# Patient Record
Sex: Male | Born: 1980 | Race: White | Hispanic: No | Marital: Married | Smoking: Current every day smoker
Health system: Southern US, Community
[De-identification: ages and names within clinical notes are randomized; demographics above are authoritative.]

## PROBLEM LIST (undated history)

## (undated) DIAGNOSIS — F1910 Other psychoactive substance abuse, uncomplicated: Secondary | ICD-10-CM

---

## 2003-05-12 ENCOUNTER — Ambulatory Visit (HOSPITAL_COMMUNITY): Admission: RE | Admit: 2003-05-12 | Discharge: 2003-05-12 | Payer: Self-pay | Admitting: *Deleted

## 2005-06-20 ENCOUNTER — Inpatient Hospital Stay (HOSPITAL_COMMUNITY): Admission: EM | Admit: 2005-06-20 | Discharge: 2005-06-23 | Payer: Self-pay | Admitting: Emergency Medicine

## 2009-05-02 ENCOUNTER — Emergency Department (HOSPITAL_COMMUNITY): Admission: EM | Admit: 2009-05-02 | Discharge: 2009-05-02 | Payer: Self-pay | Admitting: Family Medicine

## 2009-08-17 ENCOUNTER — Ambulatory Visit (HOSPITAL_COMMUNITY): Payer: Self-pay | Admitting: Psychiatry

## 2009-08-29 ENCOUNTER — Ambulatory Visit (HOSPITAL_COMMUNITY): Payer: Self-pay | Admitting: Marriage and Family Therapist

## 2009-08-29 ENCOUNTER — Ambulatory Visit (HOSPITAL_COMMUNITY): Payer: Self-pay | Admitting: Psychiatry

## 2009-09-13 ENCOUNTER — Ambulatory Visit (HOSPITAL_COMMUNITY): Payer: Self-pay | Admitting: Marriage and Family Therapist

## 2009-09-14 ENCOUNTER — Ambulatory Visit (HOSPITAL_COMMUNITY): Payer: Self-pay | Admitting: Psychiatry

## 2009-10-16 ENCOUNTER — Ambulatory Visit (HOSPITAL_COMMUNITY): Payer: Self-pay | Admitting: Psychiatry

## 2009-10-22 ENCOUNTER — Emergency Department (HOSPITAL_COMMUNITY): Admission: EM | Admit: 2009-10-22 | Discharge: 2009-10-22 | Payer: Self-pay | Admitting: Emergency Medicine

## 2010-01-02 ENCOUNTER — Ambulatory Visit (HOSPITAL_COMMUNITY): Payer: Self-pay | Admitting: Psychiatry

## 2010-02-20 ENCOUNTER — Ambulatory Visit (HOSPITAL_COMMUNITY)
Admission: RE | Admit: 2010-02-20 | Discharge: 2010-02-20 | Payer: Self-pay | Source: Home / Self Care | Attending: Psychiatry | Admitting: Psychiatry

## 2010-03-22 ENCOUNTER — Encounter (HOSPITAL_COMMUNITY): Payer: Self-pay | Admitting: Physician Assistant

## 2010-04-03 ENCOUNTER — Encounter (HOSPITAL_COMMUNITY): Payer: Self-pay | Admitting: Physician Assistant

## 2010-04-03 DIAGNOSIS — F988 Other specified behavioral and emotional disorders with onset usually occurring in childhood and adolescence: Secondary | ICD-10-CM

## 2010-04-03 DIAGNOSIS — F39 Unspecified mood [affective] disorder: Secondary | ICD-10-CM

## 2010-05-01 ENCOUNTER — Encounter (HOSPITAL_COMMUNITY): Payer: Self-pay | Admitting: Physician Assistant

## 2010-06-05 ENCOUNTER — Encounter (HOSPITAL_COMMUNITY): Payer: Self-pay | Admitting: Physician Assistant
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
## 2015-02-09 ENCOUNTER — Emergency Department
Admission: EM | Admit: 2015-02-09 | Discharge: 2015-02-09 | Payer: Medicaid Other | Attending: Emergency Medicine | Admitting: Emergency Medicine

## 2015-02-09 ENCOUNTER — Emergency Department: Payer: Medicaid Other

## 2015-02-09 ENCOUNTER — Encounter: Payer: Self-pay | Admitting: Emergency Medicine

## 2015-02-09 DIAGNOSIS — F17210 Nicotine dependence, cigarettes, uncomplicated: Secondary | ICD-10-CM | POA: Diagnosis not present

## 2015-02-09 DIAGNOSIS — X58XXXA Exposure to other specified factors, initial encounter: Secondary | ICD-10-CM | POA: Insufficient documentation

## 2015-02-09 DIAGNOSIS — Y9389 Activity, other specified: Secondary | ICD-10-CM | POA: Insufficient documentation

## 2015-02-09 DIAGNOSIS — S60222A Contusion of left hand, initial encounter: Secondary | ICD-10-CM | POA: Diagnosis not present

## 2015-02-09 DIAGNOSIS — Y9289 Other specified places as the place of occurrence of the external cause: Secondary | ICD-10-CM | POA: Diagnosis not present

## 2015-02-09 DIAGNOSIS — S6992XA Unspecified injury of left wrist, hand and finger(s), initial encounter: Secondary | ICD-10-CM | POA: Diagnosis present

## 2015-02-09 DIAGNOSIS — Y998 Other external cause status: Secondary | ICD-10-CM | POA: Insufficient documentation
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
## 2015-02-09 NOTE — ED Notes (Signed)
Ice pack given to pt.

## 2015-02-09 NOTE — Discharge Instructions (Signed)
Cryotherapy °Cryotherapy means treatment with cold. Ice or gel packs can be used to reduce both pain and swelling. Ice is the most helpful within the first 24 to 48 hours after an injury or flare-up from overusing a muscle or joint. Sprains, strains, spasms, burning pain, shooting pain, and aches can all be eased with ice. Ice can also be used when recovering from surgery. Ice is effective, has very few side effects, and is safe for most people to use. °PRECAUTIONS  °Ice is not a safe treatment option for people with: °· Raynaud phenomenon. This is a condition affecting small blood vessels in the extremities. Exposure to cold may cause your problems to return. °· Cold hypersensitivity. There are many forms of cold hypersensitivity, including: °¨ Cold urticaria. Red, itchy hives appear on the skin when the tissues begin to warm after being iced. °¨ Cold erythema. This is a red, itchy rash caused by exposure to cold. °¨ Cold hemoglobinuria. Red blood cells break down when the tissues begin to warm after being iced. The hemoglobin that carry oxygen are passed into the urine because they cannot combine with blood proteins fast enough. °· Numbness or altered sensitivity in the area being iced. °If you have any of the following conditions, do not use ice until you have discussed cryotherapy with your caregiver: °· Heart conditions, such as arrhythmia, angina, or chronic heart disease. °· High blood pressure. °· Healing wounds or open skin in the area being iced. °· Current infections. °· Rheumatoid arthritis. °· Poor circulation. °· Diabetes. °Ice slows the blood flow in the region it is applied. This is beneficial when trying to stop inflamed tissues from spreading irritating chemicals to surrounding tissues. However, if you expose your skin to cold temperatures for too long or without the proper protection, you can damage your skin or nerves. Watch for signs of skin damage due to cold. °HOME CARE INSTRUCTIONS °Follow  these tips to use ice and cold packs safely. °· Place a dry or damp towel between the ice and skin. A damp towel will cool the skin more quickly, so you may need to shorten the time that the ice is used. °· For a more rapid response, add gentle compression to the ice. °· Ice for no more than 10 to 20 minutes at a time. The bonier the area you are icing, the less time it will take to get the benefits of ice. °· Check your skin after 5 minutes to make sure there are no signs of a poor response to cold or skin damage. °· Rest 20 minutes or more between uses. °· Once your skin is numb, you can end your treatment. You can test numbness by very lightly touching your skin. The touch should be so light that you do not see the skin dimple from the pressure of your fingertip. When using ice, most people will feel these normal sensations in this order: cold, burning, aching, and numbness. °· Do not use ice on someone who cannot communicate their responses to pain, such as small children or people with dementia. °HOW TO MAKE AN ICE PACK °Ice packs are the most common way to use ice therapy. Other methods include ice massage, ice baths, and cryosprays. Muscle creams that cause a cold, tingly feeling do not offer the same benefits that ice offers and should not be used as a substitute unless recommended by your caregiver. °To make an ice pack, do one of the following: °· Place crushed ice or a   bag of frozen vegetables in a sealable plastic bag. Squeeze out the excess air. Place this bag inside another plastic bag. Slide the bag into a pillowcase or place a damp towel between your skin and the bag.  Mix 3 parts water with 1 part rubbing alcohol. Freeze the mixture in a sealable plastic bag. When you remove the mixture from the freezer, it will be slushy. Squeeze out the excess air. Place this bag inside another plastic bag. Slide the bag into a pillowcase or place a damp towel between your skin and the bag. SEEK MEDICAL CARE  IF:  You develop white spots on your skin. This may give the skin a blotchy (mottled) appearance.  Your skin turns blue or pale.  Your skin becomes waxy or hard.  Your swelling gets worse. MAKE SURE YOU:   Understand these instructions.  Will watch your condition.  Will get help right away if you are not doing well or get worse.   This information is not intended to replace advice given to you by your health care provider. Make sure you discuss any questions you have with your health care provider.   Document Released: 09/16/2010 Document Revised: 02/10/2014 Document Reviewed: 09/16/2010 Elsevier Interactive Patient Education 2016 Else[employer] Inc[employer]  Hand Contusion  A hand contusion is a deep bruise to the hand. Contusions happen when an injury causes bleeding under the skin. Signs of bruising include pain, puffiness (swelling), and discolored skin. The contusion may turn blue, purple, or yellow. HOME CARE  Put ice on the injured area.  Put ice in a plastic bag.  Place a towel between your skin and the bag.  Leave the ice on for 15-20 minutes, 03-04 times a day.  Only take medicines as told by your doctor.  Use an elastic wrap only as told. You may remove the wrap for sleeping, showering, and bathing. Take the wrap off if you lose feeling (have numbness) in your fingers, or they turn blue or cold. Put the wrap on more loosely.  Keep the hand raised (elevated) with pillows.  Avoid using your hand too much if it is painful. GET HELP RIGHT AWAY IF:   You have more redness, puffiness, or pain in your hand.  Your puffiness or pain does not get better with medicine.  You lose feeling in your hand, or you cannot move your fingers.  Your hand turns cold or blue.  You have pain when you move your fingers.  Your hand feels warm.  Your contusion does not get better in 2 days. MAKE SURE YOU:   Understand these instructions.  Will watch this condition.  Will get help  right away if you are not doing well or you get worse.   This information is not intended to replace advice given to you by your health care provider. Make sure you discuss any questions you have with your health care provider.   Document Released: 07/09/2007 Document Revised: 02/10/2014 Document Reviewed: 07/14/2011 Elsevier Interactive Patient Education [employer]

## 2021-01-04 ENCOUNTER — Encounter (HOSPITAL_BASED_OUTPATIENT_CLINIC_OR_DEPARTMENT_OTHER): Payer: Self-pay | Admitting: *Deleted

## 2021-01-04 ENCOUNTER — Emergency Department (HOSPITAL_BASED_OUTPATIENT_CLINIC_OR_DEPARTMENT_OTHER): Payer: Medicaid Other

## 2021-01-04 ENCOUNTER — Other Ambulatory Visit: Payer: Self-pay

## 2021-01-04 ENCOUNTER — Emergency Department (HOSPITAL_BASED_OUTPATIENT_CLINIC_OR_DEPARTMENT_OTHER)
Admission: EM | Admit: 2021-01-04 | Discharge: 2021-01-04 | Disposition: A | Discharge status: Home / Self Care | Payer: Medicaid Other | Attending: Emergency Medicine | Admitting: Emergency Medicine

## 2021-01-04 DIAGNOSIS — S01111A Laceration without foreign body of right eyelid and periocular area, initial encounter: Secondary | ICD-10-CM | POA: Insufficient documentation

## 2021-01-04 DIAGNOSIS — M546 Pain in thoracic spine: Secondary | ICD-10-CM | POA: Insufficient documentation

## 2021-01-04 DIAGNOSIS — F17210 Nicotine dependence, cigarettes, uncomplicated: Secondary | ICD-10-CM | POA: Diagnosis not present

## 2021-01-04 DIAGNOSIS — M545 Low back pain, unspecified: Secondary | ICD-10-CM | POA: Diagnosis not present

## 2021-01-04 DIAGNOSIS — S161XXA Strain of muscle, fascia and tendon at neck level, initial encounter: Secondary | ICD-10-CM | POA: Insufficient documentation

## 2021-01-04 DIAGNOSIS — Y92410 Unspecified street and highway as the place of occurrence of the external cause: Secondary | ICD-10-CM | POA: Diagnosis not present

## 2021-01-04 DIAGNOSIS — S169XXA Unspecified injury of muscle, fascia and tendon at neck level, initial encounter: Secondary | ICD-10-CM | POA: Diagnosis present

## 2021-01-04 HISTORY — DX: Other psychoactive substance abuse, uncomplicated: F19.10

## 2021-01-04 MED ORDER — KETOROLAC TROMETHAMINE 60 MG/2ML IM SOLN
30.0000 mg | Freq: Once | INTRAMUSCULAR | Status: DC
  Filled 2021-01-04: qty 2

## 2021-01-04 MED ORDER — IOHEXOL 350 MG/ML SOLN
100.0000 mL | Freq: Once | INTRAVENOUS | Status: AC | PRN
  Administered 2021-01-04: 75 mL via INTRAVENOUS

## 2021-01-04 MED ORDER — KETOROLAC TROMETHAMINE 30 MG/ML IJ SOLN
30.0000 mg | Freq: Once | INTRAMUSCULAR | Status: AC
  Administered 2021-01-04: 30 mg via INTRAVENOUS

## 2021-01-04 NOTE — ED Notes (Signed)
Pt transported to CT ?
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
## 2021-01-04 NOTE — ED Notes (Signed)
Per provider, ct negative, ok to remove c-collar
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
## 2021-04-19 ENCOUNTER — Emergency Department (HOSPITAL_COMMUNITY): Payer: Medicaid Other

## 2021-04-19 ENCOUNTER — Other Ambulatory Visit: Payer: Self-pay

## 2021-04-19 ENCOUNTER — Encounter (HOSPITAL_COMMUNITY): Payer: Self-pay | Admitting: *Deleted

## 2021-04-19 ENCOUNTER — Inpatient Hospital Stay (HOSPITAL_COMMUNITY)
Admission: EM | Admit: 2021-04-19 | Discharge: 2021-05-02 | DRG: 082 | Disposition: A | Discharge status: Home / Self Care | Payer: Medicaid Other | Attending: Internal Medicine | Admitting: Internal Medicine

## 2021-04-19 DIAGNOSIS — G9341 Metabolic encephalopathy: Secondary | ICD-10-CM

## 2021-04-19 DIAGNOSIS — M7022 Olecranon bursitis, left elbow: Secondary | ICD-10-CM

## 2021-04-19 DIAGNOSIS — S0101XA Laceration without foreign body of scalp, initial encounter: Secondary | ICD-10-CM | POA: Diagnosis present

## 2021-04-19 DIAGNOSIS — S066XAA Traumatic subarachnoid hemorrhage with loss of consciousness status unknown, initial encounter: Principal | ICD-10-CM | POA: Diagnosis present

## 2021-04-19 DIAGNOSIS — M4802 Spinal stenosis, cervical region: Secondary | ICD-10-CM | POA: Diagnosis present

## 2021-04-19 DIAGNOSIS — F1010 Alcohol abuse, uncomplicated: Secondary | ICD-10-CM | POA: Diagnosis present

## 2021-04-19 DIAGNOSIS — Y92410 Unspecified street and highway as the place of occurrence of the external cause: Secondary | ICD-10-CM

## 2021-04-19 DIAGNOSIS — D72829 Elevated white blood cell count, unspecified: Secondary | ICD-10-CM

## 2021-04-19 DIAGNOSIS — Z885 Allergy status to narcotic agent status: Secondary | ICD-10-CM

## 2021-04-19 DIAGNOSIS — S02119A Unspecified fracture of occiput, initial encounter for closed fracture: Secondary | ICD-10-CM | POA: Diagnosis present

## 2021-04-19 DIAGNOSIS — Z79899 Other long term (current) drug therapy: Secondary | ICD-10-CM

## 2021-04-19 DIAGNOSIS — R402142 Coma scale, eyes open, spontaneous, at arrival to emergency department: Secondary | ICD-10-CM | POA: Diagnosis present

## 2021-04-19 DIAGNOSIS — S52509A Unspecified fracture of the lower end of unspecified radius, initial encounter for closed fracture: Secondary | ICD-10-CM

## 2021-04-19 DIAGNOSIS — L02416 Cutaneous abscess of left lower limb: Secondary | ICD-10-CM | POA: Diagnosis present

## 2021-04-19 DIAGNOSIS — S065XAA Traumatic subdural hemorrhage with loss of consciousness status unknown, initial encounter: Secondary | ICD-10-CM | POA: Diagnosis present

## 2021-04-19 DIAGNOSIS — R402362 Coma scale, best motor response, obeys commands, at arrival to emergency department: Secondary | ICD-10-CM | POA: Diagnosis present

## 2021-04-19 DIAGNOSIS — F1123 Opioid dependence with withdrawal: Secondary | ICD-10-CM | POA: Diagnosis not present

## 2021-04-19 DIAGNOSIS — R509 Fever, unspecified: Secondary | ICD-10-CM

## 2021-04-19 DIAGNOSIS — E739 Lactose intolerance, unspecified: Secondary | ICD-10-CM | POA: Diagnosis present

## 2021-04-19 DIAGNOSIS — E871 Hypo-osmolality and hyponatremia: Secondary | ICD-10-CM

## 2021-04-19 DIAGNOSIS — E785 Hyperlipidemia, unspecified: Secondary | ICD-10-CM | POA: Diagnosis present

## 2021-04-19 DIAGNOSIS — F13239 Sedative, hypnotic or anxiolytic dependence with withdrawal, unspecified: Secondary | ICD-10-CM | POA: Diagnosis not present

## 2021-04-19 DIAGNOSIS — F1991 Other psychoactive substance use, unspecified, in remission: Secondary | ICD-10-CM

## 2021-04-19 DIAGNOSIS — I6330 Cerebral infarction due to thrombosis of unspecified cerebral artery: Secondary | ICD-10-CM | POA: Insufficient documentation

## 2021-04-19 DIAGNOSIS — G928 Other toxic encephalopathy: Secondary | ICD-10-CM | POA: Diagnosis present

## 2021-04-19 DIAGNOSIS — R402242 Coma scale, best verbal response, confused conversation, at arrival to emergency department: Secondary | ICD-10-CM | POA: Diagnosis present

## 2021-04-19 DIAGNOSIS — L0291 Cutaneous abscess, unspecified: Secondary | ICD-10-CM

## 2021-04-19 DIAGNOSIS — S01112A Laceration without foreign body of left eyelid and periocular area, initial encounter: Secondary | ICD-10-CM

## 2021-04-19 DIAGNOSIS — Z20822 Contact with and (suspected) exposure to covid-19: Secondary | ICD-10-CM | POA: Diagnosis present

## 2021-04-19 DIAGNOSIS — Z23 Encounter for immunization: Secondary | ICD-10-CM

## 2021-04-19 DIAGNOSIS — F17210 Nicotine dependence, cigarettes, uncomplicated: Secondary | ICD-10-CM | POA: Diagnosis present

## 2021-04-19 DIAGNOSIS — Z720 Tobacco use: Secondary | ICD-10-CM

## 2021-04-19 DIAGNOSIS — S069XAA Unspecified intracranial injury with loss of consciousness status unknown, initial encounter: Secondary | ICD-10-CM

## 2021-04-19 DIAGNOSIS — F419 Anxiety disorder, unspecified: Secondary | ICD-10-CM | POA: Diagnosis present

## 2021-04-19 DIAGNOSIS — I639 Cerebral infarction, unspecified: Secondary | ICD-10-CM

## 2021-04-19 DIAGNOSIS — G8929 Other chronic pain: Secondary | ICD-10-CM | POA: Diagnosis present

## 2021-04-19 LAB — I-STAT CHEM 8, ED
BUN: 16 mg/dL (ref 6–20)
Calcium, Ion: 1.02 mmol/L — ABNORMAL LOW (ref 1.15–1.40)
Chloride: 100 mmol/L (ref 98–111)
Creatinine, Ser: 0.7 mg/dL (ref 0.61–1.24)
Glucose, Bld: 117 mg/dL — ABNORMAL HIGH (ref 70–99)
HCT: 40 % (ref 39.0–52.0)
Hemoglobin: 13.6 g/dL (ref 13.0–17.0)
Potassium: 3.9 mmol/L (ref 3.5–5.1)
Sodium: 137 mmol/L (ref 135–145)
TCO2: 29 mmol/L (ref 22–32)

## 2021-04-19 LAB — SAMPLE TO BLOOD BANK

## 2021-04-19 MED ORDER — TETANUS-DIPHTH-ACELL PERTUSSIS 5-2.5-18.5 LF-MCG/0.5 IM SUSY
0.5000 mL | PREFILLED_SYRINGE | Freq: Once | INTRAMUSCULAR | Status: AC
  Administered 2021-04-19: 0.5 mL via INTRAMUSCULAR

## 2021-04-19 MED ORDER — SODIUM CHLORIDE 0.9 % IV BOLUS
1000.0000 mL | Freq: Once | INTRAVENOUS | Status: AC
  Administered 2021-04-19: 1000 mL via INTRAVENOUS

## 2021-04-19 MED ORDER — LIDOCAINE HCL (PF) 1 % IJ SOLN
30.0000 mL | Freq: Once | INTRAMUSCULAR | Status: AC
  Administered 2021-04-19: 30 mL
  Filled 2021-04-19: qty 30

## 2021-04-19 MED ORDER — CEFAZOLIN SODIUM-DEXTROSE 2-4 GM/100ML-% IV SOLN
2.0000 g | Freq: Once | INTRAVENOUS | Status: AC
  Administered 2021-04-19: 2 g via INTRAVENOUS

## 2021-04-19 MED ORDER — IOHEXOL 300 MG/ML  SOLN
100.0000 mL | Freq: Once | INTRAMUSCULAR | Status: AC | PRN
  Administered 2021-04-19: 100 mL via INTRAVENOUS

## 2021-04-19 MED ORDER — KETAMINE HCL 50 MG/5ML IJ SOSY
0.3000 mg/kg | PREFILLED_SYRINGE | Freq: Once | INTRAMUSCULAR | Status: AC
  Administered 2021-04-19: 20 mg via INTRAVENOUS
  Filled 2021-04-19: qty 5
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
## 2021-04-19 NOTE — ED Notes (Signed)
Difficult IV stick, MD placing US guided IV  ?

## 2021-04-19 NOTE — ED Notes (Signed)
Pt taken to CT with RN 

## 2021-04-20 ENCOUNTER — Emergency Department (HOSPITAL_COMMUNITY): Payer: Medicaid Other

## 2021-04-20 ENCOUNTER — Observation Stay (HOSPITAL_COMMUNITY): Payer: Medicaid Other

## 2021-04-20 DIAGNOSIS — S065XAA Traumatic subdural hemorrhage with loss of consciousness status unknown, initial encounter: Secondary | ICD-10-CM | POA: Diagnosis present

## 2021-04-20 LAB — CBC
HCT: 38.5 % — ABNORMAL LOW (ref 39.0–52.0)
Hemoglobin: 12.8 g/dL — ABNORMAL LOW (ref 13.0–17.0)
MCH: 31.4 pg (ref 26.0–34.0)
MCHC: 33.2 g/dL (ref 30.0–36.0)
MCV: 94.4 fL (ref 80.0–100.0)
Platelets: 271 10*3/uL (ref 150–400)
RBC: 4.08 MIL/uL — ABNORMAL LOW (ref 4.22–5.81)
RDW: 12.7 % (ref 11.5–15.5)
WBC: 10.7 10*3/uL — ABNORMAL HIGH (ref 4.0–10.5)
nRBC: 0 % (ref 0.0–0.2)

## 2021-04-20 LAB — URINALYSIS, ROUTINE W REFLEX MICROSCOPIC
Bilirubin Urine: NEGATIVE
Glucose, UA: NEGATIVE mg/dL
Hgb urine dipstick: NEGATIVE
Ketones, ur: 5 mg/dL — AB
Leukocytes,Ua: NEGATIVE
Nitrite: NEGATIVE
Protein, ur: NEGATIVE mg/dL
Specific Gravity, Urine: 1.034 — ABNORMAL HIGH (ref 1.005–1.030)
pH: 7 (ref 5.0–8.0)

## 2021-04-20 LAB — COMPREHENSIVE METABOLIC PANEL
ALT: 30 U/L (ref 0–44)
AST: 34 U/L (ref 15–41)
Albumin: 3.6 g/dL (ref 3.5–5.0)
Alkaline Phosphatase: 64 U/L (ref 38–126)
Anion gap: 8 (ref 5–15)
BUN: 14 mg/dL (ref 6–20)
CO2: 28 mmol/L (ref 22–32)
Calcium: 8.5 mg/dL — ABNORMAL LOW (ref 8.9–10.3)
Chloride: 100 mmol/L (ref 98–111)
Creatinine, Ser: 0.79 mg/dL (ref 0.61–1.24)
GFR, Estimated: >60 mL/min (ref >60)
Glucose, Bld: 118 mg/dL — ABNORMAL HIGH (ref 70–99)
Potassium: 4 mmol/L (ref 3.5–5.1)
Sodium: 136 mmol/L (ref 135–145)
Total Bilirubin: 0.2 mg/dL — ABNORMAL LOW (ref 0.3–1.2)
Total Protein: 6.1 g/dL — ABNORMAL LOW (ref 6.5–8.1)

## 2021-04-20 LAB — RESP PANEL BY RT-PCR (FLU A&B, COVID) ARPGX2
Influenza A by PCR: NEGATIVE
Influenza B by PCR: NEGATIVE
SARS Coronavirus 2 by RT PCR: NEGATIVE

## 2021-04-20 LAB — PROTIME-INR
INR: 0.9 (ref 0.8–1.2)
Prothrombin Time: 12.5 seconds (ref 11.4–15.2)

## 2021-04-20 LAB — MRSA NEXT GEN BY PCR, NASAL: MRSA by PCR Next Gen: DETECTED — AB

## 2021-04-20 LAB — LACTIC ACID, PLASMA: Lactic Acid, Venous: 1.2 mmol/L (ref 0.5–1.9)

## 2021-04-20 LAB — ETHANOL: Alcohol, Ethyl (B): <10 mg/dL (ref <10)

## 2021-04-20 LAB — HIV ANTIBODY (ROUTINE TESTING W REFLEX): HIV Screen 4th Generation wRfx: NONREACTIVE

## 2021-04-20 MED ORDER — CHLORHEXIDINE GLUCONATE CLOTH 2 % EX PADS: 6.0000 | MEDICATED_PAD | Freq: Every day | CUTANEOUS | Status: DC

## 2021-04-20 MED ORDER — ACETAMINOPHEN 325 MG PO TABS
650.0000 mg | ORAL_TABLET | Freq: Four times a day (QID) | ORAL | Status: DC | PRN
  Administered 2021-04-20 – 2021-04-25 (×8): 650 mg via ORAL
  Filled 2021-04-20 (×8): qty 2

## 2021-04-20 MED ORDER — MUPIROCIN 2 % EX OINT
1.0000 "application " | TOPICAL_OINTMENT | Freq: Two times a day (BID) | CUTANEOUS | Status: AC
  Administered 2021-04-20 – 2021-04-24 (×5): 1 via NASAL
  Filled 2021-04-20 (×2): qty 22

## 2021-04-20 MED ORDER — CHLORHEXIDINE GLUCONATE CLOTH 2 % EX PADS
6.0000 | MEDICATED_PAD | Freq: Every day | CUTANEOUS | Status: DC
  Administered 2021-04-20 – 2021-04-21 (×2): 6 via TOPICAL

## 2021-04-20 MED ORDER — BUPRENORPHINE HCL-NALOXONE HCL 2-0.5 MG SL SUBL
1.0000 | SUBLINGUAL_TABLET | Freq: Every day | SUBLINGUAL | Status: DC
  Administered 2021-04-20: 1 via SUBLINGUAL
  Filled 2021-04-20: qty 1

## 2021-04-20 MED ORDER — WHITE PETROLATUM EX OINT
TOPICAL_OINTMENT | CUTANEOUS | Status: DC | PRN
  Administered 2021-04-20: 1 via TOPICAL
  Filled 2021-04-20 (×2): qty 28.35
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
## 2021-04-20 NOTE — Plan of Care (Signed)
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
## 2021-04-20 NOTE — Progress Notes (Signed)
OT Cancellation Note ? ?Patient Details ?Name: Ethan Pearson ?MRN: [id number] ?DOB: May 07, 1980 ? ? ?Cancelled Treatment:    Reason Eval/Treat Not Completed: OT spoke with evaluating PT.  Advised limited eval due to agitation, OT to continue efforts as appropriate. ? ?Jaquasia Doscher D Ahmoni Edge ?04/20/2021, 1:42 PM ?
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
## 2021-04-20 NOTE — Progress Notes (Signed)
VO from Dr. Danielle Dess for suboxone. ?

## 2021-04-20 NOTE — Progress Notes (Signed)
?  Transition of Care (TOC) Screening Note ? ? ?Patient Details  ?Name: Ethan Pearson ?Date of Birth: 1980/07/05 ? ? ?Transition of Care (TOC) CM/SW Contact:    ?Alfredia Ferguson, LCSW ?Phone Number: ?04/20/2021, 8:35 AM ? ? ? ?Transition of Care Department [facility]) has reviewed patient and noted no immediate TOC needs pending continued medical work-up. TOC team will continue to monitor patient advancement through interdisciplinary progression rounds to support any identified discharge supports as needed. If new patient transition needs arise, please place a TOC consult or reach out to [facility] team.  ?  ?

## 2021-04-20 NOTE — Progress Notes (Signed)
SLP Cancellation Note ? ?Patient Details ?Name: Ethan Pearson ?MRN: [id number] ?DOB: October 14, 1980 ? ? ?Cancelled treatment:        RN and PT advised that SLP hold evaluation today as patient becoming agitated this am. Appears to be well above a Rancho Level IV based on discussion as patient following commands, attending to task and communicating well per their observation. Will f/u next date.  ? ?Ferdinand Lango MA, CCC-SLP ? ? ? ?Ethan Pearson ?04/20/2021, 11:18 AM ? ? ?

## 2021-04-20 NOTE — Progress Notes (Signed)
Patient got out of bed and to the toilet on his own while this nurse was in another patient room. Upon realizing, I attempted to get a urine sample for UDS but patient had completed voiding. Patient returned to bed safely and placed back on monitor. ?

## 2021-04-21 DIAGNOSIS — R402142 Coma scale, eyes open, spontaneous, at arrival to emergency department: Secondary | ICD-10-CM | POA: Diagnosis present

## 2021-04-21 DIAGNOSIS — R402242 Coma scale, best verbal response, confused conversation, at arrival to emergency department: Secondary | ICD-10-CM | POA: Diagnosis present

## 2021-04-21 DIAGNOSIS — Z20822 Contact with and (suspected) exposure to covid-19: Secondary | ICD-10-CM | POA: Diagnosis present

## 2021-04-21 DIAGNOSIS — Z79899 Other long term (current) drug therapy: Secondary | ICD-10-CM | POA: Diagnosis not present

## 2021-04-21 DIAGNOSIS — S062X0A Diffuse traumatic brain injury without loss of consciousness, initial encounter: Secondary | ICD-10-CM | POA: Diagnosis not present

## 2021-04-21 DIAGNOSIS — R402362 Coma scale, best motor response, obeys commands, at arrival to emergency department: Secondary | ICD-10-CM | POA: Diagnosis present

## 2021-04-21 DIAGNOSIS — S02119A Unspecified fracture of occiput, initial encounter for closed fracture: Secondary | ICD-10-CM | POA: Diagnosis present

## 2021-04-21 DIAGNOSIS — L02416 Cutaneous abscess of left lower limb: Secondary | ICD-10-CM | POA: Diagnosis present

## 2021-04-21 DIAGNOSIS — Y92410 Unspecified street and highway as the place of occurrence of the external cause: Secondary | ICD-10-CM | POA: Diagnosis not present

## 2021-04-21 DIAGNOSIS — F17210 Nicotine dependence, cigarettes, uncomplicated: Secondary | ICD-10-CM | POA: Diagnosis present

## 2021-04-21 DIAGNOSIS — Z23 Encounter for immunization: Secondary | ICD-10-CM | POA: Diagnosis not present

## 2021-04-21 DIAGNOSIS — E739 Lactose intolerance, unspecified: Secondary | ICD-10-CM | POA: Diagnosis present

## 2021-04-21 DIAGNOSIS — M7022 Olecranon bursitis, left elbow: Secondary | ICD-10-CM | POA: Diagnosis present

## 2021-04-21 DIAGNOSIS — S065XAA Traumatic subdural hemorrhage with loss of consciousness status unknown, initial encounter: Secondary | ICD-10-CM | POA: Diagnosis present

## 2021-04-21 DIAGNOSIS — F13239 Sedative, hypnotic or anxiolytic dependence with withdrawal, unspecified: Secondary | ICD-10-CM | POA: Diagnosis not present

## 2021-04-21 DIAGNOSIS — L0291 Cutaneous abscess, unspecified: Secondary | ICD-10-CM | POA: Diagnosis not present

## 2021-04-21 DIAGNOSIS — E785 Hyperlipidemia, unspecified: Secondary | ICD-10-CM | POA: Diagnosis present

## 2021-04-21 DIAGNOSIS — F05 Delirium due to known physiological condition: Secondary | ICD-10-CM | POA: Diagnosis not present

## 2021-04-21 DIAGNOSIS — R410 Disorientation, unspecified: Secondary | ICD-10-CM | POA: Diagnosis not present

## 2021-04-21 DIAGNOSIS — F1010 Alcohol abuse, uncomplicated: Secondary | ICD-10-CM | POA: Diagnosis present

## 2021-04-21 DIAGNOSIS — D72829 Elevated white blood cell count, unspecified: Secondary | ICD-10-CM | POA: Diagnosis not present

## 2021-04-21 DIAGNOSIS — S066XAA Traumatic subarachnoid hemorrhage with loss of consciousness status unknown, initial encounter: Secondary | ICD-10-CM | POA: Diagnosis present

## 2021-04-21 DIAGNOSIS — G8929 Other chronic pain: Secondary | ICD-10-CM | POA: Diagnosis present

## 2021-04-21 DIAGNOSIS — S01112A Laceration without foreign body of left eyelid and periocular area, initial encounter: Secondary | ICD-10-CM | POA: Diagnosis present

## 2021-04-21 DIAGNOSIS — G928 Other toxic encephalopathy: Secondary | ICD-10-CM | POA: Diagnosis present

## 2021-04-21 DIAGNOSIS — I639 Cerebral infarction, unspecified: Secondary | ICD-10-CM | POA: Diagnosis not present

## 2021-04-21 DIAGNOSIS — Z720 Tobacco use: Secondary | ICD-10-CM | POA: Diagnosis not present

## 2021-04-21 DIAGNOSIS — F1123 Opioid dependence with withdrawal: Secondary | ICD-10-CM | POA: Diagnosis not present

## 2021-04-21 DIAGNOSIS — M4802 Spinal stenosis, cervical region: Secondary | ICD-10-CM | POA: Diagnosis present

## 2021-04-21 DIAGNOSIS — S0101XA Laceration without foreign body of scalp, initial encounter: Secondary | ICD-10-CM | POA: Diagnosis present

## 2021-04-21 DIAGNOSIS — Z885 Allergy status to narcotic agent status: Secondary | ICD-10-CM | POA: Diagnosis not present

## 2021-04-21 DIAGNOSIS — F1129 Opioid dependence with unspecified opioid-induced disorder: Secondary | ICD-10-CM | POA: Diagnosis not present

## 2021-04-21 DIAGNOSIS — G9341 Metabolic encephalopathy: Secondary | ICD-10-CM | POA: Diagnosis not present

## 2021-04-21 DIAGNOSIS — I6330 Cerebral infarction due to thrombosis of unspecified cerebral artery: Secondary | ICD-10-CM | POA: Diagnosis not present

## 2021-04-21 DIAGNOSIS — I6389 Other cerebral infarction: Secondary | ICD-10-CM | POA: Diagnosis not present

## 2021-04-21 DIAGNOSIS — E871 Hypo-osmolality and hyponatremia: Secondary | ICD-10-CM | POA: Diagnosis not present

## 2021-04-21 LAB — CBC WITH DIFFERENTIAL/PLATELET
Abs Immature Granulocytes: 0.13 10*3/uL — ABNORMAL HIGH (ref 0.00–0.07)
Basophils Absolute: 0 10*3/uL (ref 0.0–0.1)
Basophils Relative: 0 %
Eosinophils Absolute: 0 10*3/uL (ref 0.0–0.5)
Eosinophils Relative: 0 %
HCT: 36.4 % — ABNORMAL LOW (ref 39.0–52.0)
Hemoglobin: 12.5 g/dL — ABNORMAL LOW (ref 13.0–17.0)
Immature Granulocytes: 1 %
Lymphocytes Relative: 7 %
Lymphs Abs: 1.4 10*3/uL (ref 0.7–4.0)
MCH: 30.2 pg (ref 26.0–34.0)
MCHC: 34.3 g/dL (ref 30.0–36.0)
MCV: 87.9 fL (ref 80.0–100.0)
Monocytes Absolute: 1.5 10*3/uL — ABNORMAL HIGH (ref 0.1–1.0)
Monocytes Relative: 7 %
Neutro Abs: 17.8 10*3/uL — ABNORMAL HIGH (ref 1.7–7.7)
Neutrophils Relative %: 85 %
Platelets: 303 10*3/uL (ref 150–400)
RBC: 4.14 MIL/uL — ABNORMAL LOW (ref 4.22–5.81)
RDW: 12.5 % (ref 11.5–15.5)
WBC: 21 10*3/uL — ABNORMAL HIGH (ref 4.0–10.5)
nRBC: 0 % (ref 0.0–0.2)

## 2021-04-21 LAB — HIV ANTIBODY (ROUTINE TESTING W REFLEX): HIV Screen 4th Generation wRfx: NONREACTIVE

## 2021-04-21 LAB — BASIC METABOLIC PANEL
Anion gap: 12 (ref 5–15)
BUN: 14 mg/dL (ref 6–20)
CO2: 22 mmol/L (ref 22–32)
Calcium: 9.1 mg/dL (ref 8.9–10.3)
Chloride: 98 mmol/L (ref 98–111)
Creatinine, Ser: 0.52 mg/dL — ABNORMAL LOW (ref 0.61–1.24)
GFR, Estimated: >60 mL/min (ref >60)
Glucose, Bld: 144 mg/dL — ABNORMAL HIGH (ref 70–99)
Potassium: 3.5 mmol/L (ref 3.5–5.1)
Sodium: 132 mmol/L — ABNORMAL LOW (ref 135–145)

## 2021-04-21 LAB — AMMONIA: Ammonia: 35 umol/L (ref 9–35)

## 2021-04-21 LAB — MAGNESIUM: Magnesium: 1.6 mg/dL — ABNORMAL LOW (ref 1.7–2.4)

## 2021-04-21 LAB — VITAMIN B12: Vitamin B-12: 357 pg/mL (ref 180–914)

## 2021-04-21 LAB — TSH: TSH: 0.077 u[IU]/mL — ABNORMAL LOW (ref 0.350–4.500)

## 2021-04-21 MED ORDER — ONDANSETRON HCL 4 MG/2ML IJ SOLN
4.0000 mg | Freq: Four times a day (QID) | INTRAMUSCULAR | Status: DC | PRN
  Administered 2021-04-21: 4 mg via INTRAVENOUS
  Filled 2021-04-21: qty 2

## 2021-04-21 MED ORDER — FOLIC ACID 1 MG PO TABS
1.0000 mg | ORAL_TABLET | Freq: Every day | ORAL | Status: DC
  Administered 2021-04-21 – 2021-05-02 (×12): 1 mg via ORAL
  Filled 2021-04-21 (×12): qty 1

## 2021-04-21 MED ORDER — MIDAZOLAM HCL 2 MG/2ML IJ SOLN
1.0000 mg | INTRAMUSCULAR | Status: DC | PRN
  Administered 2021-04-21: 1 mg via INTRAVENOUS
  Filled 2021-04-21 (×2): qty 2

## 2021-04-21 MED ORDER — DEXMEDETOMIDINE HCL IN NACL 400 MCG/100ML IV SOLN
0.2000 ug/kg/h | INTRAVENOUS | Status: DC
  Administered 2021-04-21: 0.7 ug/kg/h via INTRAVENOUS
  Administered 2021-04-21: 0.2 ug/kg/h via INTRAVENOUS
  Administered 2021-04-22: 0.9 ug/kg/h via INTRAVENOUS
  Filled 2021-04-21 (×3): qty 100

## 2021-04-21 MED ORDER — ONDANSETRON HCL 4 MG/2ML IJ SOLN
INTRAMUSCULAR | Status: AC
  Filled 2021-04-21: qty 2

## 2021-04-21 MED ORDER — ALPRAZOLAM 0.5 MG PO TABS
0.5000 mg | ORAL_TABLET | Freq: Three times a day (TID) | ORAL | Status: DC
  Administered 2021-04-21 – 2021-05-02 (×35): 0.5 mg via ORAL
  Filled 2021-04-21 (×35): qty 1

## 2021-04-21 MED ORDER — BUPRENORPHINE HCL-NALOXONE HCL 2-0.5 MG SL SUBL
4.0000 | SUBLINGUAL_TABLET | Freq: Every day | SUBLINGUAL | Status: DC
  Administered 2021-04-21: 4 via SUBLINGUAL
  Filled 2021-04-21: qty 4

## 2021-04-21 MED ORDER — ADULT MULTIVITAMIN W/MINERALS CH
1.0000 | ORAL_TABLET | Freq: Every day | ORAL | Status: DC
  Administered 2021-04-21 – 2021-05-02 (×12): 1 via ORAL
  Filled 2021-04-21 (×12): qty 1

## 2021-04-21 MED ORDER — NICOTINE 21 MG/24HR TD PT24
21.0000 mg | MEDICATED_PATCH | Freq: Every day | TRANSDERMAL | Status: DC
  Administered 2021-04-21 – 2021-05-02 (×13): 21 mg via TRANSDERMAL
  Filled 2021-04-21 (×13): qty 1

## 2021-04-21 MED ORDER — GABAPENTIN 600 MG PO TABS
600.0000 mg | ORAL_TABLET | Freq: Three times a day (TID) | ORAL | Status: DC
  Administered 2021-04-21 – 2021-05-02 (×35): 600 mg via ORAL
  Filled 2021-04-21 (×37): qty 1

## 2021-04-21 MED ORDER — BACITRACIN ZINC 500 UNIT/GM EX OINT
TOPICAL_OINTMENT | Freq: Two times a day (BID) | CUTANEOUS | Status: DC
  Administered 2021-04-21 – 2021-04-24 (×2): 1 via TOPICAL
  Filled 2021-04-21: qty 28.4
  Filled 2021-04-21: qty 0.9

## 2021-04-21 NOTE — Progress Notes (Signed)
eLink Physician-Brief Progress Note ?Patient Name: Ethan Pearson ?DOB: May 13, 1980 ?MRN: [id number] ? ? ?Date of Service ? 04/21/2021  ?HPI/Events of Note ? Notified that pt with increasing agitation. ?Mentatnion improving, but attempts to reorient him have been unsuccessful.   ?eICU Interventions ? Pt currently on precedex infusion. Increased ceiling dose to 1.75mcg/kg/hr. Will monitor response. Watch for development of hypotension/bradycardia.   ? ? ? ?  ? ?Thurnell Garbe CRUZ ?04/21/2021, 10:06 PM ?
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
## 2021-04-21 NOTE — Progress Notes (Signed)
Patient ID: Ethan Pearson, male   DOB: September 12, 1980, 41 y.o.   MRN: [id number] ?Patient has been quite agitated today follows commands occasionally but is developed nausea sweating in addition to agitation.  It appears that he is going through some form of withdrawal. ? ?I have asked critical care to evaluate and intervene as appropriate. ? ?His CT scan yesterday shows that there has been some evolution of the small contusions and small subdural hemorrhage that was noted.  These are noncritical nature.  We will continue to manage him medically.  He should remain in the intensive care unit for now. ?

## 2021-04-21 NOTE — Progress Notes (Signed)
Patient responds at times and at others does not. Will follow commands at times and at others will not. Patient got out of bed without notification, would not sit down when asked to have a seat. Patient ran to bathroom where he had a large BM in the toilet and then persisted to vomit all over the floor not wanting to use bucket provided. Patient also started perspiring. Writer spoke to Dr. Elyn Peers and obtained order for wrist restraints as well as zofran. Primifit placed on patient to prevent voiding in bed and restraints applied.  ?
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
## 2021-04-21 NOTE — Progress Notes (Signed)
Patient found attempting to get out of bed and vomiting. Along with other staff, this RN cleaned patient and returned safely to bed. Requested posey belt in addition to wrist restraints already in place. Also requested medications to help with withdrawal. CCM restarted home meds and added precedex for agitation. Nsx gave VO to remove cervical collar. Patient currently resting calmly in bed. ?

## 2021-04-21 NOTE — Progress Notes (Addendum)
Pt woke up very agitated, c/o pain and discomfort of being "tied down"; pt with bilateral soft wrist restraints and waist belt restraint r/t pt impulsiveness and inability to remain in the bed and follow safety standards. Pt able to answer person, place and situation orientation questions; anything further he gets agitated. Pt had also removed his external urinary catheter, which was explained to him in place to assist with "catching" his urine and keeping his bed dry and clean; pt requested to have the urinal nearby instead. While in the room with the pt, waist belt was loosened and wrist restraints untied so pt could move more freely. Pt also requesting "something for pain", stating his back and head hurt. Pt given his currently prescribed evening medication, then inquired about his suboxone which he takes at home 3 times a day. [facility] called to inquire about increasing pt suboxone dose, based off dayshift note, or current Precedex dose to assist with pt agitation. Pt currently relaxed back in bed after being told of the medication inquiry to the dr. Fredderick Phenix re-secured so pt does not feel constrained but also maintaining pt safety.  ?
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
## 2021-04-21 NOTE — Progress Notes (Signed)
With assistance of OT, this RN dressed patient's head lacerations with saline gauze on front and bacitracin on back. Patient tolerated process well. Upon returning to bed, patient removed all dressings. ?

## 2021-04-22 DIAGNOSIS — E871 Hypo-osmolality and hyponatremia: Secondary | ICD-10-CM | POA: Diagnosis not present

## 2021-04-22 DIAGNOSIS — S065XAA Traumatic subdural hemorrhage with loss of consciousness status unknown, initial encounter: Secondary | ICD-10-CM | POA: Diagnosis not present

## 2021-04-22 DIAGNOSIS — R410 Disorientation, unspecified: Secondary | ICD-10-CM

## 2021-04-22 LAB — CBC
HCT: 37.5 % — ABNORMAL LOW (ref 39.0–52.0)
Hemoglobin: 13.2 g/dL (ref 13.0–17.0)
MCH: 30.6 pg (ref 26.0–34.0)
MCHC: 35.2 g/dL (ref 30.0–36.0)
MCV: 87 fL (ref 80.0–100.0)
Platelets: 266 10*3/uL (ref 150–400)
RBC: 4.31 MIL/uL (ref 4.22–5.81)
RDW: 12.4 % (ref 11.5–15.5)
WBC: 15.4 10*3/uL — ABNORMAL HIGH (ref 4.0–10.5)
nRBC: 0 % (ref 0.0–0.2)

## 2021-04-22 LAB — COMPREHENSIVE METABOLIC PANEL
ALT: 24 U/L (ref 0–44)
AST: 21 U/L (ref 15–41)
Albumin: 3.5 g/dL (ref 3.5–5.0)
Alkaline Phosphatase: 65 U/L (ref 38–126)
Anion gap: 9 (ref 5–15)
BUN: 15 mg/dL (ref 6–20)
CO2: 24 mmol/L (ref 22–32)
Calcium: 9.2 mg/dL (ref 8.9–10.3)
Chloride: 101 mmol/L (ref 98–111)
Creatinine, Ser: 0.67 mg/dL (ref 0.61–1.24)
GFR, Estimated: >60 mL/min (ref >60)
Glucose, Bld: 135 mg/dL — ABNORMAL HIGH (ref 70–99)
Potassium: 3.9 mmol/L (ref 3.5–5.1)
Sodium: 134 mmol/L — ABNORMAL LOW (ref 135–145)
Total Bilirubin: 0.6 mg/dL (ref 0.3–1.2)
Total Protein: 6.9 g/dL (ref 6.5–8.1)

## 2021-04-22 LAB — RPR: RPR Ser Ql: NONREACTIVE

## 2021-04-22 LAB — T4, FREE: Free T4: 1.02 ng/dL (ref 0.61–1.12)

## 2021-04-22 LAB — MAGNESIUM: Magnesium: 2 mg/dL (ref 1.7–2.4)

## 2021-04-22 MED ORDER — BUPRENORPHINE HCL-NALOXONE HCL 8-2 MG SL SUBL
1.0000 | SUBLINGUAL_TABLET | Freq: Two times a day (BID) | SUBLINGUAL | Status: DC
  Administered 2021-04-22: 1 via SUBLINGUAL
  Filled 2021-04-22: qty 1

## 2021-04-22 MED ORDER — THIAMINE HCL 100 MG PO TABS
100.0000 mg | ORAL_TABLET | Freq: Every day | ORAL | Status: DC
  Administered 2021-04-23 – 2021-05-02 (×10): 100 mg via ORAL
  Filled 2021-04-22 (×10): qty 1

## 2021-04-22 MED ORDER — BUPRENORPHINE HCL-NALOXONE HCL 8-2 MG SL SUBL
1.0000 | SUBLINGUAL_TABLET | Freq: Three times a day (TID) | SUBLINGUAL | Status: DC
  Administered 2021-04-22 – 2021-04-25 (×11): 1 via SUBLINGUAL
  Filled 2021-04-22 (×14): qty 1
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
## 2021-04-23 DIAGNOSIS — F05 Delirium due to known physiological condition: Secondary | ICD-10-CM

## 2021-04-23 DIAGNOSIS — S065XAA Traumatic subdural hemorrhage with loss of consciousness status unknown, initial encounter: Secondary | ICD-10-CM | POA: Diagnosis not present

## 2021-04-23 DIAGNOSIS — F1129 Opioid dependence with unspecified opioid-induced disorder: Secondary | ICD-10-CM | POA: Diagnosis not present

## 2021-04-23 LAB — T3, FREE: T3, Free: 2 pg/mL (ref 2.0–4.4)

## 2021-04-23 NOTE — Progress Notes (Signed)
Patient continues to pull off tele monitoring equipment and refuses to leave it on.  Patient has been educated on the importance of tele monitoring and continues to keep it on. Dr. Windell Norfolk has been notified.   ?
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
## 2021-04-23 NOTE — Progress Notes (Signed)
Patient removed tele box from his chest and threw the box and wires against the door.  MD and charge RN notified. ?
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
## 2021-04-24 DIAGNOSIS — D72829 Elevated white blood cell count, unspecified: Secondary | ICD-10-CM

## 2021-04-24 DIAGNOSIS — S52509A Unspecified fracture of the lower end of unspecified radius, initial encounter for closed fracture: Secondary | ICD-10-CM

## 2021-04-24 DIAGNOSIS — G9341 Metabolic encephalopathy: Secondary | ICD-10-CM

## 2021-04-24 DIAGNOSIS — S069XAA Unspecified intracranial injury with loss of consciousness status unknown, initial encounter: Secondary | ICD-10-CM

## 2021-04-24 DIAGNOSIS — Z720 Tobacco use: Secondary | ICD-10-CM

## 2021-04-24 DIAGNOSIS — S065XAA Traumatic subdural hemorrhage with loss of consciousness status unknown, initial encounter: Secondary | ICD-10-CM | POA: Diagnosis not present

## 2021-04-24 DIAGNOSIS — F1991 Other psychoactive substance use, unspecified, in remission: Secondary | ICD-10-CM

## 2021-04-24 LAB — COMPREHENSIVE METABOLIC PANEL
ALT: 20 U/L (ref 0–44)
AST: 17 U/L (ref 15–41)
Albumin: 3.2 g/dL — ABNORMAL LOW (ref 3.5–5.0)
Alkaline Phosphatase: 57 U/L (ref 38–126)
Anion gap: 13 (ref 5–15)
BUN: 10 mg/dL (ref 6–20)
CO2: 23 mmol/L (ref 22–32)
Calcium: 8.9 mg/dL (ref 8.9–10.3)
Chloride: 95 mmol/L — ABNORMAL LOW (ref 98–111)
Creatinine, Ser: 0.69 mg/dL (ref 0.61–1.24)
GFR, Estimated: >60 mL/min (ref >60)
Glucose, Bld: 156 mg/dL — ABNORMAL HIGH (ref 70–99)
Potassium: 3.5 mmol/L (ref 3.5–5.1)
Sodium: 131 mmol/L — ABNORMAL LOW (ref 135–145)
Total Bilirubin: 0.7 mg/dL (ref 0.3–1.2)
Total Protein: 6.1 g/dL — ABNORMAL LOW (ref 6.5–8.1)

## 2021-04-24 LAB — CBC WITH DIFFERENTIAL/PLATELET
Abs Immature Granulocytes: 0.09 10*3/uL — ABNORMAL HIGH (ref 0.00–0.07)
Basophils Absolute: 0 10*3/uL (ref 0.0–0.1)
Basophils Relative: 0 %
Eosinophils Absolute: 0 10*3/uL (ref 0.0–0.5)
Eosinophils Relative: 0 %
HCT: 34.9 % — ABNORMAL LOW (ref 39.0–52.0)
Hemoglobin: 12.6 g/dL — ABNORMAL LOW (ref 13.0–17.0)
Immature Granulocytes: 1 %
Lymphocytes Relative: 11 %
Lymphs Abs: 1.9 10*3/uL (ref 0.7–4.0)
MCH: 31.3 pg (ref 26.0–34.0)
MCHC: 36.1 g/dL — ABNORMAL HIGH (ref 30.0–36.0)
MCV: 86.6 fL (ref 80.0–100.0)
Monocytes Absolute: 1.2 10*3/uL — ABNORMAL HIGH (ref 0.1–1.0)
Monocytes Relative: 7 %
Neutro Abs: 14.6 10*3/uL — ABNORMAL HIGH (ref 1.7–7.7)
Neutrophils Relative %: 81 %
Platelets: 300 10*3/uL (ref 150–400)
RBC: 4.03 MIL/uL — ABNORMAL LOW (ref 4.22–5.81)
RDW: 12.2 % (ref 11.5–15.5)
WBC: 17.9 10*3/uL — ABNORMAL HIGH (ref 4.0–10.5)
nRBC: 0 % (ref 0.0–0.2)

## 2021-04-24 MED ORDER — MELATONIN 5 MG PO TABS
5.0000 mg | ORAL_TABLET | Freq: Every evening | ORAL | Status: DC | PRN
  Administered 2021-04-24 – 2021-04-29 (×6): 5 mg via ORAL
  Filled 2021-04-24 (×6): qty 1
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
## 2021-04-24 NOTE — Assessment & Plan Note (Addendum)
Improved, related to withdrawal ?Patient placed back on home regimen xanax ?He started refusing suboxone when we started oxycodone,  ?Suboxone discontinued to avoid precipitating withdrawal with continued need for pain management with oxycodone -likely resume Suboxone on discharge. ?

## 2021-04-24 NOTE — Progress Notes (Signed)
PT Cancellation Note ? ?Patient Details ?Name: Ethan Pearson ?MRN: [id number] ?DOB: [birth year] ? ? ?Cancelled Treatment:    Reason Eval/Treat Not Completed: Patient declined, no reason specified.  Pt refused after having worked with OT for less than 10 min. ?04/24/2021 ? ?Jacinto Halim., PT ?Acute Rehabilitation Services ?[phone]  (pager) ?[phone]  (office) ? ? ?Eliseo Gum Karlis Cregg ?04/24/2021, 5:33 PM ?
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
## 2021-04-24 NOTE — Progress Notes (Signed)
Patient ID: Ethan Pearson, male   DOB: 15-Nov-1980, 41 y.o.   MRN: [id number] ?Neurologically stable ?Follow-up with neurosurgery can be on a as needed basis. ?

## 2021-04-24 NOTE — Assessment & Plan Note (Addendum)
Hx IV heroin use ?Chronic suboxone and benzo ?We will need to follow back up at his outpatient Suboxone clinic on discharge. ?

## 2021-04-24 NOTE — Assessment & Plan Note (Addendum)
Possibly reactive -  ?Does have bursitis of L elbow and superficial abscess of R knee ?Blood cx from 3/19 no growth, afebrile ?On doxycycline for abscess, culture pending ?-Augmentin added to regimen 04/30/2021 with improving leukocytosis which was subsequently fluctuating. ?-Outpatient follow-up. ?
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
## 2021-04-24 NOTE — Assessment & Plan Note (Addendum)
Cast removed per ortho, he's been placed in velcro splint ?Per ortho, can d/c over next week or so as pain and function allows ?Patient seen by PT and will follow-up with PT in the outpatient setting. ?

## 2021-04-25 ENCOUNTER — Inpatient Hospital Stay (HOSPITAL_COMMUNITY): Payer: Medicaid Other

## 2021-04-25 DIAGNOSIS — I639 Cerebral infarction, unspecified: Secondary | ICD-10-CM

## 2021-04-25 DIAGNOSIS — S01112A Laceration without foreign body of left eyelid and periocular area, initial encounter: Secondary | ICD-10-CM

## 2021-04-25 DIAGNOSIS — S065XAA Traumatic subdural hemorrhage with loss of consciousness status unknown, initial encounter: Secondary | ICD-10-CM | POA: Diagnosis not present

## 2021-04-25 DIAGNOSIS — E871 Hypo-osmolality and hyponatremia: Secondary | ICD-10-CM

## 2021-04-25 LAB — COMPREHENSIVE METABOLIC PANEL
ALT: 17 U/L (ref 0–44)
AST: 13 U/L — ABNORMAL LOW (ref 15–41)
Albumin: 3.2 g/dL — ABNORMAL LOW (ref 3.5–5.0)
Alkaline Phosphatase: 53 U/L (ref 38–126)
Anion gap: 9 (ref 5–15)
BUN: 11 mg/dL (ref 6–20)
CO2: 22 mmol/L (ref 22–32)
Calcium: 8.8 mg/dL — ABNORMAL LOW (ref 8.9–10.3)
Chloride: 98 mmol/L (ref 98–111)
Creatinine, Ser: 0.54 mg/dL — ABNORMAL LOW (ref 0.61–1.24)
GFR, Estimated: >60 mL/min (ref >60)
Glucose, Bld: 123 mg/dL — ABNORMAL HIGH (ref 70–99)
Potassium: 4.6 mmol/L (ref 3.5–5.1)
Sodium: 129 mmol/L — ABNORMAL LOW (ref 135–145)
Total Bilirubin: 1.1 mg/dL (ref 0.3–1.2)
Total Protein: 6.4 g/dL — ABNORMAL LOW (ref 6.5–8.1)

## 2021-04-25 LAB — CBC WITH DIFFERENTIAL/PLATELET
Abs Immature Granulocytes: 0.09 10*3/uL — ABNORMAL HIGH (ref 0.00–0.07)
Basophils Absolute: 0 10*3/uL (ref 0.0–0.1)
Basophils Relative: 0 %
Eosinophils Absolute: 0 10*3/uL (ref 0.0–0.5)
Eosinophils Relative: 0 %
HCT: 36 % — ABNORMAL LOW (ref 39.0–52.0)
Hemoglobin: 12.3 g/dL — ABNORMAL LOW (ref 13.0–17.0)
Immature Granulocytes: 1 %
Lymphocytes Relative: 14 %
Lymphs Abs: 2.6 10*3/uL (ref 0.7–4.0)
MCH: 30.1 pg (ref 26.0–34.0)
MCHC: 34.2 g/dL (ref 30.0–36.0)
MCV: 88 fL (ref 80.0–100.0)
Monocytes Absolute: 1.6 10*3/uL — ABNORMAL HIGH (ref 0.1–1.0)
Monocytes Relative: 9 %
Neutro Abs: 14.5 10*3/uL — ABNORMAL HIGH (ref 1.7–7.7)
Neutrophils Relative %: 76 %
Platelets: 313 10*3/uL (ref 150–400)
RBC: 4.09 MIL/uL — ABNORMAL LOW (ref 4.22–5.81)
RDW: 12.2 % (ref 11.5–15.5)
WBC: 18.8 10*3/uL — ABNORMAL HIGH (ref 4.0–10.5)
nRBC: 0 % (ref 0.0–0.2)

## 2021-04-25 LAB — OSMOLALITY, URINE: Osmolality, Ur: 457 mOsm/kg (ref 300–900)

## 2021-04-25 LAB — MAGNESIUM: Magnesium: 1.9 mg/dL (ref 1.7–2.4)

## 2021-04-25 LAB — PHOSPHORUS: Phosphorus: 4.1 mg/dL (ref 2.5–4.6)

## 2021-04-25 LAB — SODIUM, URINE, RANDOM: Sodium, Ur: 49 mmol/L

## 2021-04-25 LAB — OSMOLALITY: Osmolality: 271 mOsm/kg — ABNORMAL LOW (ref 275–295)

## 2021-04-25 MED ORDER — ACETAMINOPHEN 325 MG PO TABS
650.0000 mg | ORAL_TABLET | Freq: Four times a day (QID) | ORAL | Status: DC | PRN
  Administered 2021-04-25 – 2021-04-30 (×6): 650 mg via ORAL
  Filled 2021-04-25 (×6): qty 2

## 2021-04-25 MED ORDER — ACETAMINOPHEN 325 MG PO TABS: 650.0000 mg | ORAL_TABLET | ORAL | Status: DC | PRN

## 2021-04-25 MED ORDER — ACETAMINOPHEN 325 MG PO TABS: 650.0000 mg | ORAL_TABLET | Freq: Four times a day (QID) | ORAL | Status: DC | PRN

## 2021-04-25 MED ORDER — ACETAMINOPHEN 325 MG PO TABS
650.0000 mg | ORAL_TABLET | Freq: Once | ORAL | Status: AC
  Administered 2021-04-25: 650 mg via ORAL
  Filled 2021-04-25: qty 2
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
## 2021-04-25 NOTE — Assessment & Plan Note (Addendum)
Negative plain films of bilateral le's ?Was placed on oxycodone every 6 hours as needed during the hospitalization. ?Due to history of being on prior Suboxone patient was discharged on ibuprofen 800 mg 3 times daily x5 days and needs to follow-up in the Suboxone clinic. ?

## 2021-04-25 NOTE — Progress Notes (Signed)
Patient ID: Ethan Pearson, male   DOB: 03/22/1980, 41 y.o.   MRN: [id number] ?Patient seen for further follow-up regarding closed head injury.  He has been complaining of headache and head pain and repeat CT today demonstrates that the patient's contusions in the frontal and temporal lobe on the right side are shown her typical evolution there is no new bleeding however it is noted that there is subtle changes of some cerebellar infarcts these are ischemic processes.  Patient does have a nondisplaced occipital bone fracture and concern is whether he could have some vascular injury.  Discussed with Dr. Lowell Guitar this evening and he will order MRI and MR imaging of the cervical vessels particularly in regards to the vertebral arteries.  Of also suggested a consultation with neurology service regarding stroke.  Continue supportive care for now. ?

## 2021-04-25 NOTE — Progress Notes (Signed)
This nurse arrived to the unit, and unit nurse, Johnney Ou, reported that he no longer needed an IV start. Fran Lowes, RN VAST ?

## 2021-04-25 NOTE — Assessment & Plan Note (Addendum)
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  ?Repeat cortisol level at 23.2.  ?-Sodium levels improved with fluid restriction.   ?-Patient with discharge on 1 L/day fluid restriction. ?

## 2021-04-25 NOTE — Assessment & Plan Note (Addendum)
Repaired in ED with prolene on 3/18 ?Sutures removed ?
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
## 2021-04-26 ENCOUNTER — Inpatient Hospital Stay (HOSPITAL_COMMUNITY): Payer: Medicaid Other

## 2021-04-26 ENCOUNTER — Encounter (HOSPITAL_COMMUNITY): Payer: Self-pay

## 2021-04-26 DIAGNOSIS — S065XAA Traumatic subdural hemorrhage with loss of consciousness status unknown, initial encounter: Secondary | ICD-10-CM | POA: Diagnosis not present

## 2021-04-26 DIAGNOSIS — D72829 Elevated white blood cell count, unspecified: Secondary | ICD-10-CM | POA: Diagnosis not present

## 2021-04-26 DIAGNOSIS — S062X0A Diffuse traumatic brain injury without loss of consciousness, initial encounter: Secondary | ICD-10-CM

## 2021-04-26 DIAGNOSIS — Z720 Tobacco use: Secondary | ICD-10-CM | POA: Diagnosis not present

## 2021-04-26 DIAGNOSIS — I6389 Other cerebral infarction: Secondary | ICD-10-CM

## 2021-04-26 DIAGNOSIS — E871 Hypo-osmolality and hyponatremia: Secondary | ICD-10-CM | POA: Diagnosis not present

## 2021-04-26 DIAGNOSIS — I6330 Cerebral infarction due to thrombosis of unspecified cerebral artery: Secondary | ICD-10-CM | POA: Insufficient documentation

## 2021-04-26 DIAGNOSIS — I639 Cerebral infarction, unspecified: Secondary | ICD-10-CM

## 2021-04-26 LAB — BASIC METABOLIC PANEL
Anion gap: 12 (ref 5–15)
Anion gap: 8 (ref 5–15)
BUN: 12 mg/dL (ref 6–20)
BUN: 13 mg/dL (ref 6–20)
CO2: 22 mmol/L (ref 22–32)
CO2: 27 mmol/L (ref 22–32)
Calcium: 8.8 mg/dL — ABNORMAL LOW (ref 8.9–10.3)
Calcium: 9 mg/dL (ref 8.9–10.3)
Chloride: 94 mmol/L — ABNORMAL LOW (ref 98–111)
Chloride: 95 mmol/L — ABNORMAL LOW (ref 98–111)
Creatinine, Ser: 0.63 mg/dL (ref 0.61–1.24)
Creatinine, Ser: 0.71 mg/dL (ref 0.61–1.24)
GFR, Estimated: >60 mL/min (ref >60)
GFR, Estimated: >60 mL/min (ref >60)
Glucose, Bld: 100 mg/dL — ABNORMAL HIGH (ref 70–99)
Glucose, Bld: 135 mg/dL — ABNORMAL HIGH (ref 70–99)
Potassium: 4.5 mmol/L (ref 3.5–5.1)
Potassium: 4.5 mmol/L (ref 3.5–5.1)
Sodium: 128 mmol/L — ABNORMAL LOW (ref 135–145)
Sodium: 130 mmol/L — ABNORMAL LOW (ref 135–145)

## 2021-04-26 LAB — LIPID PANEL
Cholesterol: 148 mg/dL (ref 0–200)
HDL: 39 mg/dL — ABNORMAL LOW (ref >40)
LDL Cholesterol: 95 mg/dL (ref 0–99)
Total CHOL/HDL Ratio: 3.8 RATIO
Triglycerides: 72 mg/dL (ref <150)
VLDL: 14 mg/dL (ref 0–40)

## 2021-04-26 LAB — RAPID URINE DRUG SCREEN, HOSP PERFORMED
Amphetamines: NOT DETECTED
Barbiturates: NOT DETECTED
Benzodiazepines: POSITIVE — AB
Cocaine: NOT DETECTED
Opiates: NOT DETECTED
Tetrahydrocannabinol: POSITIVE — AB

## 2021-04-26 LAB — ECHOCARDIOGRAM COMPLETE
AR max vel: 2.21 cm2
AV Area VTI: 1.91 cm2
AV Area mean vel: 1.96 cm2
AV Mean grad: 5 mmHg
AV Peak grad: 8.3 mmHg
Ao pk vel: 1.44 m/s
Area-P 1/2: 2.52 cm2
Height: 69 in
S' Lateral: 3 cm
Weight: 2271.62 oz

## 2021-04-26 LAB — HEMOGLOBIN A1C
Hgb A1c MFr Bld: 5.6 % (ref 4.8–5.6)
Mean Plasma Glucose: 114.02 mg/dL

## 2021-04-26 LAB — CULTURE, BLOOD (ROUTINE X 2)
Culture: NO GROWTH
Culture: NO GROWTH

## 2021-04-26 LAB — CBC
HCT: 38 % — ABNORMAL LOW (ref 39.0–52.0)
Hemoglobin: 13 g/dL (ref 13.0–17.0)
MCH: 30.2 pg (ref 26.0–34.0)
MCHC: 34.2 g/dL (ref 30.0–36.0)
MCV: 88.4 fL (ref 80.0–100.0)
Platelets: 339 10*3/uL (ref 150–400)
RBC: 4.3 MIL/uL (ref 4.22–5.81)
RDW: 12.5 % (ref 11.5–15.5)
WBC: 15.1 10*3/uL — ABNORMAL HIGH (ref 4.0–10.5)
nRBC: 0 % (ref 0.0–0.2)

## 2021-04-26 MED ORDER — OXYCODONE HCL 5 MG PO TABS
5.0000 mg | ORAL_TABLET | Freq: Four times a day (QID) | ORAL | Status: DC | PRN
  Administered 2021-04-26 – 2021-05-02 (×17): 5 mg via ORAL
  Filled 2021-04-26 (×18): qty 1

## 2021-04-26 MED ORDER — SODIUM CHLORIDE 0.9 % IV SOLN: INTRAVENOUS | Status: DC

## 2021-04-26 MED ORDER — ROSUVASTATIN CALCIUM 5 MG PO TABS
10.0000 mg | ORAL_TABLET | Freq: Every day | ORAL | Status: DC
  Administered 2021-04-26 – 2021-05-02 (×7): 10 mg via ORAL
  Filled 2021-04-26 (×7): qty 2

## 2021-04-26 MED ORDER — CHLORHEXIDINE GLUCONATE CLOTH 2 % EX PADS: 6.0000 | MEDICATED_PAD | Freq: Every day | CUTANEOUS | Status: DC

## 2021-04-26 MED ORDER — SODIUM CHLORIDE 0.9 % IV SOLN: INTRAVENOUS | Status: AC
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
## 2021-04-26 NOTE — Progress Notes (Signed)
Pt and mother educated on importance of strict intake and output measurements.  Pt and mother verbalized understanding.  Will continue to monitor and provide education as needed.  ?
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
## 2021-04-26 NOTE — Progress Notes (Signed)
Patient transported down to radiology and MRI via bed and transporter. No s/s of distress noted. Alert and Oriented x4.  ?
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
## 2021-04-26 NOTE — Progress Notes (Signed)
Pt and mother at bedside concerned about pts wounds on elbow and posterior hear.  Wounds oozing and left elbow is swollen and sore to touch.  Pt has been refusing ointment on abrasions/wounds.  Education provided to pt on importance of wound care.  Pt and mother verbalized understanding.  MD notified of wounds.  New orders received.  Will continue to monitor.   ?

## 2021-04-26 NOTE — Progress Notes (Signed)
Patient returned back from MRI via bed and transporter. No s/s of distress. Patient alert and oriented. Complains of headache.  ?

## 2021-04-26 NOTE — Progress Notes (Signed)
Echocardiogram ?2D Echocardiogram has been performed. ? ?Devonne Doughty ?04/26/2021, 10:34 AM ?
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
## 2021-04-26 NOTE — Progress Notes (Signed)
OT Cancellation Note ? ?Patient Details ?Name: Ethan Pearson ?MRN: [id number] ?DOB: July 21, 1980 ? ? ?Cancelled Treatment:    Reason Eval/Treat Not Completed: Patient declined, no reason specified (Patient asked therapist to come back later today when he is less sleepy.  OT to attempt later today.) ?Alfonse Flavors, OTA ?Acute Rehabilitation Services  ?Pager [phone] ?Office [phone] ? ?Shakiya Mcneary Jeannett Senior ?04/26/2021, 12:33 PM ?

## 2021-04-26 NOTE — Progress Notes (Signed)
PT Cancellation Note ? ?Patient Details ?Name: Ethan Pearson ?MRN: [id number] ?DOB: 27-Feb-1980 ? ? ?Cancelled Treatment:    Reason Eval/Treat Not Completed: Patient declined, no reason specified.   Pt refused x1 will return around 2 pm, pt agrees. ?04/26/2021 ? ?Ginger Carne., PT ?Acute Rehabilitation Services ?[phone]  (pager) ?[phone]  (office) ? ? ?Ethan Pearson ?04/26/2021, 12:18 PM ?

## 2021-04-26 NOTE — Progress Notes (Signed)
SLP Cancellation Note ? ?Patient Details ?Name: Ethan Pearson ?MRN: [id number] ?DOB: 25-Feb-1980 ? ? ?Cancelled treatment:        Attempted cognitive therapy. Pt reported a "bad headache" and asked if we could "do it later". SLP will attempt to return as schedule allows.  ? ? ?Royce Macadamia ?04/26/2021, 10:32 AM ?

## 2021-04-27 ENCOUNTER — Inpatient Hospital Stay (HOSPITAL_COMMUNITY): Payer: Medicaid Other

## 2021-04-27 DIAGNOSIS — Z720 Tobacco use: Secondary | ICD-10-CM | POA: Diagnosis not present

## 2021-04-27 DIAGNOSIS — L0291 Cutaneous abscess, unspecified: Secondary | ICD-10-CM

## 2021-04-27 DIAGNOSIS — D72829 Elevated white blood cell count, unspecified: Secondary | ICD-10-CM | POA: Diagnosis not present

## 2021-04-27 DIAGNOSIS — M7022 Olecranon bursitis, left elbow: Secondary | ICD-10-CM

## 2021-04-27 DIAGNOSIS — E871 Hypo-osmolality and hyponatremia: Secondary | ICD-10-CM | POA: Diagnosis not present

## 2021-04-27 LAB — CBC WITH DIFFERENTIAL/PLATELET
Abs Immature Granulocytes: 0.07 10*3/uL (ref 0.00–0.07)
Basophils Absolute: 0 10*3/uL (ref 0.0–0.1)
Basophils Relative: 0 %
Eosinophils Absolute: 0.1 10*3/uL (ref 0.0–0.5)
Eosinophils Relative: 1 %
HCT: 36 % — ABNORMAL LOW (ref 39.0–52.0)
Hemoglobin: 12.7 g/dL — ABNORMAL LOW (ref 13.0–17.0)
Immature Granulocytes: 0 %
Lymphocytes Relative: 22 %
Lymphs Abs: 3.5 10*3/uL (ref 0.7–4.0)
MCH: 31.2 pg (ref 26.0–34.0)
MCHC: 35.3 g/dL (ref 30.0–36.0)
MCV: 88.5 fL (ref 80.0–100.0)
Monocytes Absolute: 1.4 10*3/uL — ABNORMAL HIGH (ref 0.1–1.0)
Monocytes Relative: 9 %
Neutro Abs: 10.6 10*3/uL — ABNORMAL HIGH (ref 1.7–7.7)
Neutrophils Relative %: 68 %
Platelets: 354 10*3/uL (ref 150–400)
RBC: 4.07 MIL/uL — ABNORMAL LOW (ref 4.22–5.81)
RDW: 12.4 % (ref 11.5–15.5)
WBC: 15.8 10*3/uL — ABNORMAL HIGH (ref 4.0–10.5)
nRBC: 0 % (ref 0.0–0.2)

## 2021-04-27 LAB — BASIC METABOLIC PANEL
Anion gap: 9 (ref 5–15)
BUN: 13 mg/dL (ref 6–20)
CO2: 25 mmol/L (ref 22–32)
Calcium: 8.7 mg/dL — ABNORMAL LOW (ref 8.9–10.3)
Chloride: 95 mmol/L — ABNORMAL LOW (ref 98–111)
Creatinine, Ser: 0.61 mg/dL (ref 0.61–1.24)
GFR, Estimated: >60 mL/min (ref >60)
Glucose, Bld: 100 mg/dL — ABNORMAL HIGH (ref 70–99)
Potassium: 4.6 mmol/L (ref 3.5–5.1)
Sodium: 129 mmol/L — ABNORMAL LOW (ref 135–145)

## 2021-04-27 LAB — DRUG PROFILE, UR, 9 DRUGS (LABCORP)
Amphetamines, Urine: NEGATIVE ng/mL
Barbiturate, Ur: NEGATIVE ng/mL
Benzodiazepine Quant, Ur: NEGATIVE ng/mL
Cannabinoid Quant, Ur: POSITIVE ng/mL — AB
Cocaine (Metab.): NEGATIVE ng/mL
Methadone Screen, Urine: NEGATIVE ng/mL
Opiate Quant, Ur: POSITIVE ng/mL — AB
Phencyclidine, Ur: NEGATIVE ng/mL
Propoxyphene, Urine: NEGATIVE ng/mL

## 2021-04-27 MED ORDER — SODIUM CHLORIDE 0.9 % IV SOLN
INTRAVENOUS | Status: AC
Start: 2021-04-27 — End: 2021-04-28

## 2021-04-27 MED ORDER — LIDOCAINE HCL (PF) 1 % IJ SOLN
5.0000 mL | Freq: Once | INTRAMUSCULAR | Status: AC
  Administered 2021-04-27: 5 mL via SUBCUTANEOUS
  Filled 2021-04-27: qty 5

## 2021-04-27 MED ORDER — DOXYCYCLINE HYCLATE 100 MG PO TABS
100.0000 mg | ORAL_TABLET | Freq: Two times a day (BID) | ORAL | Status: DC
  Administered 2021-04-27 – 2021-05-01 (×8): 100 mg via ORAL
  Filled 2021-04-27 (×8): qty 1
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
## 2021-04-27 NOTE — Assessment & Plan Note (Addendum)
L elbow olecranon bursitis with draining wound, compressive ace wrap per ortho ?Outpatient follow-up with orthopedics. ?

## 2021-04-27 NOTE — Assessment & Plan Note (Addendum)
L knee, s/p drainage by ortho  ?MRSA sensitive to tetracyclines ?Patient placed on doxycycline and will be discharged home on 6 more days of doxycycline to complete a 10-day course of treatment.   ?-Outpatient follow-up with orthopedics and PCP. ?

## 2021-04-28 ENCOUNTER — Inpatient Hospital Stay (HOSPITAL_COMMUNITY): Payer: Medicaid Other

## 2021-04-28 DIAGNOSIS — D72829 Elevated white blood cell count, unspecified: Secondary | ICD-10-CM | POA: Diagnosis not present

## 2021-04-28 DIAGNOSIS — E871 Hypo-osmolality and hyponatremia: Secondary | ICD-10-CM | POA: Diagnosis not present

## 2021-04-28 DIAGNOSIS — Z720 Tobacco use: Secondary | ICD-10-CM | POA: Diagnosis not present

## 2021-04-28 LAB — CBC WITH DIFFERENTIAL/PLATELET
Abs Immature Granulocytes: 0.08 10*3/uL — ABNORMAL HIGH (ref 0.00–0.07)
Basophils Absolute: 0 10*3/uL (ref 0.0–0.1)
Basophils Relative: 0 %
Eosinophils Absolute: 0.1 10*3/uL (ref 0.0–0.5)
Eosinophils Relative: 1 %
HCT: 35.1 % — ABNORMAL LOW (ref 39.0–52.0)
Hemoglobin: 12.2 g/dL — ABNORMAL LOW (ref 13.0–17.0)
Immature Granulocytes: 1 %
Lymphocytes Relative: 20 %
Lymphs Abs: 2.9 10*3/uL (ref 0.7–4.0)
MCH: 30.3 pg (ref 26.0–34.0)
MCHC: 34.8 g/dL (ref 30.0–36.0)
MCV: 87.1 fL (ref 80.0–100.0)
Monocytes Absolute: 1.5 10*3/uL — ABNORMAL HIGH (ref 0.1–1.0)
Monocytes Relative: 10 %
Neutro Abs: 9.5 10*3/uL — ABNORMAL HIGH (ref 1.7–7.7)
Neutrophils Relative %: 68 %
Platelets: 422 10*3/uL — ABNORMAL HIGH (ref 150–400)
RBC: 4.03 MIL/uL — ABNORMAL LOW (ref 4.22–5.81)
RDW: 12.1 % (ref 11.5–15.5)
WBC: 14 10*3/uL — ABNORMAL HIGH (ref 4.0–10.5)
nRBC: 0 % (ref 0.0–0.2)

## 2021-04-28 LAB — MAGNESIUM: Magnesium: 2 mg/dL (ref 1.7–2.4)

## 2021-04-28 LAB — PHOSPHORUS: Phosphorus: 4.1 mg/dL (ref 2.5–4.6)

## 2021-04-28 LAB — COMPREHENSIVE METABOLIC PANEL
ALT: 38 U/L (ref 0–44)
AST: 24 U/L (ref 15–41)
Albumin: 2.9 g/dL — ABNORMAL LOW (ref 3.5–5.0)
Alkaline Phosphatase: 58 U/L (ref 38–126)
Anion gap: 8 (ref 5–15)
BUN: 11 mg/dL (ref 6–20)
CO2: 25 mmol/L (ref 22–32)
Calcium: 8.5 mg/dL — ABNORMAL LOW (ref 8.9–10.3)
Chloride: 94 mmol/L — ABNORMAL LOW (ref 98–111)
Creatinine, Ser: 0.71 mg/dL (ref 0.61–1.24)
GFR, Estimated: >60 mL/min (ref >60)
Glucose, Bld: 129 mg/dL — ABNORMAL HIGH (ref 70–99)
Potassium: 4 mmol/L (ref 3.5–5.1)
Sodium: 127 mmol/L — ABNORMAL LOW (ref 135–145)
Total Bilirubin: 0.2 mg/dL — ABNORMAL LOW (ref 0.3–1.2)
Total Protein: 6.4 g/dL — ABNORMAL LOW (ref 6.5–8.1)

## 2021-04-28 MED ORDER — OXYCODONE HCL 5 MG PO TABS: 5.0000 mg | ORAL_TABLET | Freq: Once | ORAL | Status: DC | PRN

## 2021-04-28 NOTE — Progress Notes (Signed)
Orthopedic Tech Progress Note ?Patient Details:  ?Ethan Pearson ?[birth year] ?[id number] ? ?Ortho Devices ?Type of Ortho Device: Velcro wrist splint ?Ortho Device/Splint Location: rue ?Ortho Device/Splint Interventions: Ordered, Application, Adjustment ?  ?Post Interventions ?Patient Tolerated: Well ? ?[name] ?04/28/2021, 2:36 PM ? ?
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
## 2021-04-29 ENCOUNTER — Inpatient Hospital Stay (HOSPITAL_COMMUNITY): Payer: Medicaid Other

## 2021-04-29 DIAGNOSIS — E871 Hypo-osmolality and hyponatremia: Secondary | ICD-10-CM | POA: Diagnosis not present

## 2021-04-29 DIAGNOSIS — D72829 Elevated white blood cell count, unspecified: Secondary | ICD-10-CM | POA: Diagnosis not present

## 2021-04-29 DIAGNOSIS — R509 Fever, unspecified: Secondary | ICD-10-CM

## 2021-04-29 DIAGNOSIS — Z720 Tobacco use: Secondary | ICD-10-CM | POA: Diagnosis not present

## 2021-04-29 LAB — URINALYSIS, COMPLETE (UACMP) WITH MICROSCOPIC
Bilirubin Urine: NEGATIVE
Glucose, UA: NEGATIVE mg/dL
Hgb urine dipstick: NEGATIVE
Ketones, ur: NEGATIVE mg/dL
Leukocytes,Ua: NEGATIVE
Nitrite: NEGATIVE
Protein, ur: NEGATIVE mg/dL
Specific Gravity, Urine: 1.012 (ref 1.005–1.030)
pH: 7 (ref 5.0–8.0)

## 2021-04-29 LAB — CBC WITH DIFFERENTIAL/PLATELET
Abs Immature Granulocytes: 0.07 10*3/uL (ref 0.00–0.07)
Basophils Absolute: 0 10*3/uL (ref 0.0–0.1)
Basophils Relative: 0 %
Eosinophils Absolute: 0.1 10*3/uL (ref 0.0–0.5)
Eosinophils Relative: 1 %
HCT: 40.7 % (ref 39.0–52.0)
Hemoglobin: 13.6 g/dL (ref 13.0–17.0)
Immature Granulocytes: 1 %
Lymphocytes Relative: 24 %
Lymphs Abs: 2.9 10*3/uL (ref 0.7–4.0)
MCH: 30 pg (ref 26.0–34.0)
MCHC: 33.4 g/dL (ref 30.0–36.0)
MCV: 89.6 fL (ref 80.0–100.0)
Monocytes Absolute: 0.9 10*3/uL (ref 0.1–1.0)
Monocytes Relative: 8 %
Neutro Abs: 8.2 10*3/uL — ABNORMAL HIGH (ref 1.7–7.7)
Neutrophils Relative %: 66 %
Platelets: 458 10*3/uL — ABNORMAL HIGH (ref 150–400)
RBC: 4.54 MIL/uL (ref 4.22–5.81)
RDW: 12.3 % (ref 11.5–15.5)
WBC: 12.2 10*3/uL — ABNORMAL HIGH (ref 4.0–10.5)
nRBC: 0 % (ref 0.0–0.2)

## 2021-04-29 LAB — RAPID URINE DRUG SCREEN, HOSP PERFORMED
Amphetamines: NOT DETECTED
Barbiturates: NOT DETECTED
Benzodiazepines: POSITIVE — AB
Cocaine: NOT DETECTED
Opiates: NOT DETECTED
Tetrahydrocannabinol: POSITIVE — AB

## 2021-04-29 LAB — BASIC METABOLIC PANEL
Anion gap: 8 (ref 5–15)
BUN: 12 mg/dL (ref 6–20)
CO2: 27 mmol/L (ref 22–32)
Calcium: 9 mg/dL (ref 8.9–10.3)
Chloride: 92 mmol/L — ABNORMAL LOW (ref 98–111)
Creatinine, Ser: 0.69 mg/dL (ref 0.61–1.24)
GFR, Estimated: >60 mL/min (ref >60)
Glucose, Bld: 136 mg/dL — ABNORMAL HIGH (ref 70–99)
Potassium: 4.3 mmol/L (ref 3.5–5.1)
Sodium: 127 mmol/L — ABNORMAL LOW (ref 135–145)

## 2021-04-29 LAB — SODIUM, URINE, RANDOM: Sodium, Ur: 67 mmol/L

## 2021-04-29 LAB — OSMOLALITY, URINE: Osmolality, Ur: 433 mOsm/kg (ref 300–900)

## 2021-04-29 MED ORDER — MUPIROCIN 2 % EX OINT
1.0000 "application " | TOPICAL_OINTMENT | Freq: Two times a day (BID) | CUTANEOUS | Status: DC
  Administered 2021-05-01 – 2021-05-02 (×3): 1 via NASAL
  Filled 2021-04-29: qty 22

## 2021-04-29 NOTE — Progress Notes (Signed)
Patient showered, thoroughly cleansing the laceration to his anterior forehead, posterior scalp, left elbow, and left knee. Shower allowed softening and cleansing of the laceration on forehead. Nurse was able to carefully remove the 7 sutures remaining. Slight bleeding noted during removal due to scabbing over the suture sites. Area cleansed and bacitracin applied. Patient tolerated procedure very well.  ? ?A glass shard was noted to the anterior forehead just below patient's hairline. Nurse removed 75mm glass shard carefully. No bleeding noted following removal. Bacitracin applied. Patient tolerated procedure very well. ? ?Left elbow and left knee wounds cleansed thoroughly, bacitracin applied, and wounds wrapped accordingly. Patient tolerate dressing changes well.  ? ?ALL 12 SUTURES HAVE BEEN REMOVED FROM LACERATION TO PATIENT'S FOREHEAD!!!  ? ?
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
## 2021-04-29 NOTE — Progress Notes (Signed)
PT Cancellation Note ? ?Patient Details ?Name: Ethan Pearson ?MRN: [id number] ?DOB: 07-08-1980 ? ? ?Cancelled Treatment:    Reason Eval/Treat Not Completed: Patient declined, no reason specified;Other (comment) (x3) opportunities. ?04/29/2021 ? ?Jacinto Halim., PT ?Acute Rehabilitation Services ?[phone]  (pager) ?[phone]  (office) ? ? ?Eliseo Gum Kham Zuckerman ?04/29/2021, 6:42 PM ?
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
## 2021-04-29 NOTE — Assessment & Plan Note (Addendum)
Had a L elbow bursitis which spontaneously dranied and knee superficial abscess which was drained by orthopedics  ?Culture from abscess with MRSA sensitive to tetracycliens ?Wound to back of head looks more erythematous/swollen and does have some yellowish discharge, but doesn't seem fluctuant or look like it needs drainage at this time. ?-Augmentin added to regimen of doxycycline. ?-Fever curve trended down. ?-Leukocytosis fluctuating however patient continued to remain afebrile after addition of Augmentin. ?-Blood cultures with no growth to date x3 days. ?-Knee abscess with MRSA. ?-Patient continue on doxycycline and Augmentin and be discharged home on 6 more days of antibiotics to complete a 10-day course of treatment. ?-Outpatient follow-up with PCP and orthopedics. ?

## 2021-04-29 NOTE — Assessment & Plan Note (Addendum)
MRI brain with small L cerebella acute infarct ?MRA head/neck normal ?Echo with EF 55-60%, no RWMA ?A1c 5.6 ?LDL 95 ?Patient was seen in consultation by neurology, who suspects acute CVA was due to brain trauma, no evidence of vertebral dissection ?No antiplatelet given hemorrhages and stroke thought related to trauma.  ?Patient maintained on statin. ?Outpatient follow-up with neurology. ? ?

## 2021-04-29 NOTE — Progress Notes (Signed)
Patient refused his morning labs today. Patient refused his labs yesterday morning as well. Patient may allow for lab draw later in the day. Will continue to monitor. ?
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
## 2021-04-30 LAB — AEROBIC CULTURE W GRAM STAIN (SUPERFICIAL SPECIMEN)

## 2021-04-30 LAB — CBC WITH DIFFERENTIAL/PLATELET
Abs Immature Granulocytes: 0.07 10*3/uL (ref 0.00–0.07)
Basophils Absolute: 0 10*3/uL (ref 0.0–0.1)
Basophils Relative: 0 %
Eosinophils Absolute: 0 10*3/uL (ref 0.0–0.5)
Eosinophils Relative: 0 %
HCT: 39 % (ref 39.0–52.0)
Hemoglobin: 13.6 g/dL (ref 13.0–17.0)
Immature Granulocytes: 1 %
Lymphocytes Relative: 15 %
Lymphs Abs: 1.6 10*3/uL (ref 0.7–4.0)
MCH: 30.8 pg (ref 26.0–34.0)
MCHC: 34.9 g/dL (ref 30.0–36.0)
MCV: 88.2 fL (ref 80.0–100.0)
Monocytes Absolute: 0.8 10*3/uL (ref 0.1–1.0)
Monocytes Relative: 8 %
Neutro Abs: 8.5 10*3/uL — ABNORMAL HIGH (ref 1.7–7.7)
Neutrophils Relative %: 76 %
Platelets: 530 10*3/uL — ABNORMAL HIGH (ref 150–400)
RBC: 4.42 MIL/uL (ref 4.22–5.81)
RDW: 12.1 % (ref 11.5–15.5)
WBC: 11.1 10*3/uL — ABNORMAL HIGH (ref 4.0–10.5)
nRBC: 0 % (ref 0.0–0.2)

## 2021-04-30 LAB — BASIC METABOLIC PANEL
Anion gap: 9 (ref 5–15)
BUN: 12 mg/dL (ref 6–20)
CO2: 29 mmol/L (ref 22–32)
Calcium: 9.2 mg/dL (ref 8.9–10.3)
Chloride: 94 mmol/L — ABNORMAL LOW (ref 98–111)
Creatinine, Ser: 0.66 mg/dL (ref 0.61–1.24)
GFR, Estimated: >60 mL/min (ref >60)
Glucose, Bld: 99 mg/dL (ref 70–99)
Potassium: 4.5 mmol/L (ref 3.5–5.1)
Sodium: 132 mmol/L — ABNORMAL LOW (ref 135–145)

## 2021-04-30 MED ORDER — AMOXICILLIN-POT CLAVULANATE 875-125 MG PO TABS
1.0000 | ORAL_TABLET | Freq: Two times a day (BID) | ORAL | Status: DC
Start: 2021-04-30 — End: 2021-05-02
  Administered 2021-04-30 – 2021-05-02 (×5): 1 via ORAL
  Filled 2021-04-30 (×5): qty 1
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
## 2021-04-30 NOTE — Plan of Care (Signed)
  Problem: Pain Managment: Goal: General experience of comfort will improve Outcome: Progressing   Problem: Safety: Goal: Ability to remain free from injury will improve Outcome: Progressing   
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
## 2021-05-01 DIAGNOSIS — L0291 Cutaneous abscess, unspecified: Secondary | ICD-10-CM

## 2021-05-01 DIAGNOSIS — R509 Fever, unspecified: Secondary | ICD-10-CM

## 2021-05-01 DIAGNOSIS — F1991 Other psychoactive substance use, unspecified, in remission: Secondary | ICD-10-CM

## 2021-05-01 DIAGNOSIS — S52501A Unspecified fracture of the lower end of right radius, initial encounter for closed fracture: Secondary | ICD-10-CM

## 2021-05-01 DIAGNOSIS — S01112A Laceration without foreign body of left eyelid and periocular area, initial encounter: Secondary | ICD-10-CM

## 2021-05-01 DIAGNOSIS — I639 Cerebral infarction, unspecified: Secondary | ICD-10-CM

## 2021-05-01 DIAGNOSIS — M7022 Olecranon bursitis, left elbow: Secondary | ICD-10-CM

## 2021-05-01 DIAGNOSIS — G9341 Metabolic encephalopathy: Secondary | ICD-10-CM

## 2021-05-01 DIAGNOSIS — S01112D Laceration without foreign body of left eyelid and periocular area, subsequent encounter: Secondary | ICD-10-CM

## 2021-05-01 DIAGNOSIS — Z8659 Personal history of other mental and behavioral disorders: Secondary | ICD-10-CM

## 2021-05-01 DIAGNOSIS — L02416 Cutaneous abscess of left lower limb: Secondary | ICD-10-CM

## 2021-05-01 DIAGNOSIS — I6330 Cerebral infarction due to thrombosis of unspecified cerebral artery: Secondary | ICD-10-CM

## 2021-05-01 LAB — CORTISOL: Cortisol, Plasma: 23.2 ug/dL

## 2021-05-01 LAB — CBC WITH DIFFERENTIAL/PLATELET
Abs Immature Granulocytes: 0.06 10*3/uL (ref 0.00–0.07)
Basophils Absolute: 0 10*3/uL (ref 0.0–0.1)
Basophils Relative: 0 %
Eosinophils Absolute: 0 10*3/uL (ref 0.0–0.5)
Eosinophils Relative: 0 %
HCT: 39.8 % (ref 39.0–52.0)
Hemoglobin: 13.9 g/dL (ref 13.0–17.0)
Immature Granulocytes: 1 %
Lymphocytes Relative: 17 %
Lymphs Abs: 1.8 10*3/uL (ref 0.7–4.0)
MCH: 30.8 pg (ref 26.0–34.0)
MCHC: 34.9 g/dL (ref 30.0–36.0)
MCV: 88.2 fL (ref 80.0–100.0)
Monocytes Absolute: 0.8 10*3/uL (ref 0.1–1.0)
Monocytes Relative: 7 %
Neutro Abs: 7.9 10*3/uL — ABNORMAL HIGH (ref 1.7–7.7)
Neutrophils Relative %: 75 %
Platelets: 598 10*3/uL — ABNORMAL HIGH (ref 150–400)
RBC: 4.51 MIL/uL (ref 4.22–5.81)
RDW: 12.4 % (ref 11.5–15.5)
WBC: 10.6 10*3/uL — ABNORMAL HIGH (ref 4.0–10.5)
nRBC: 0 % (ref 0.0–0.2)

## 2021-05-01 LAB — COMPREHENSIVE METABOLIC PANEL
ALT: 66 U/L — ABNORMAL HIGH (ref 0–44)
AST: 37 U/L (ref 15–41)
Albumin: 3.4 g/dL — ABNORMAL LOW (ref 3.5–5.0)
Alkaline Phosphatase: 66 U/L (ref 38–126)
Anion gap: 10 (ref 5–15)
BUN: 11 mg/dL (ref 6–20)
CO2: 26 mmol/L (ref 22–32)
Calcium: 9.4 mg/dL (ref 8.9–10.3)
Chloride: 100 mmol/L (ref 98–111)
Creatinine, Ser: 0.95 mg/dL (ref 0.61–1.24)
GFR, Estimated: >60 mL/min (ref >60)
Glucose, Bld: 104 mg/dL — ABNORMAL HIGH (ref 70–99)
Potassium: 4.3 mmol/L (ref 3.5–5.1)
Sodium: 136 mmol/L (ref 135–145)
Total Bilirubin: 0.5 mg/dL (ref 0.3–1.2)
Total Protein: 7.3 g/dL (ref 6.5–8.1)

## 2021-05-01 LAB — C-REACTIVE PROTEIN: CRP: 1.2 mg/dL — ABNORMAL HIGH (ref <1.0)

## 2021-05-01 LAB — PHOSPHORUS: Phosphorus: 3.8 mg/dL (ref 2.5–4.6)

## 2021-05-01 LAB — TSH: TSH: 0.923 u[IU]/mL (ref 0.350–4.500)

## 2021-05-01 LAB — GLUCOSE, CAPILLARY: Glucose-Capillary: 144 mg/dL — ABNORMAL HIGH (ref 70–99)

## 2021-05-01 LAB — MAGNESIUM: Magnesium: 2.1 mg/dL (ref 1.7–2.4)

## 2021-05-01 MED ORDER — DOXYCYCLINE HYCLATE 100 MG PO TABS
100.0000 mg | ORAL_TABLET | Freq: Two times a day (BID) | ORAL | Status: DC
  Administered 2021-05-01 – 2021-05-02 (×2): 100 mg via ORAL
  Filled 2021-05-01 (×2): qty 1
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
## 2021-05-01 NOTE — Assessment & Plan Note (Addendum)
-   Tobacco cessation stressed to patient. ?

## 2021-05-01 NOTE — Assessment & Plan Note (Signed)
See above

## 2021-05-01 NOTE — TOC CAGE-AID Note (Signed)
Transition of Care (TOC) - CAGE-AID Screening ? ? ?Patient Details  ?Name: Ethan Pearson ?MRN: [id number] ?Date of Birth: 1980/05/29 ? ?Transition of Care (TOC) CM/SW Contact:    ?Kermit Balo, RN ?Phone Number: ?05/01/2021, 1:43 PM ? ? ?Clinical Narrative: ?Patient provided resources for inpatient/ outpatient drug counseling.  ? ? ?CAGE-AID Screening: ?  ? ?Have You Ever Felt You Ought to Cut Down on Your Drinking or Drug Use?: Yes ?Have People Annoyed You By Critizing Your Drinking Or Drug Use?: Yes ?Have You Felt Bad Or Guilty About Your Drinking Or Drug Use?: Yes ?Have You Ever Had a Drink or Used Drugs First Thing In The Morning to Steady Your Nerves or to Get Rid of a Hangover?: Yes ?CAGE-AID Score: 4 ? ?Substance Abuse Education Offered: Yes ? ?Substance abuse interventions: Referral to (must comment) (TOC consult) ? ? ? ? ? ? ?

## 2021-05-01 NOTE — Progress Notes (Signed)
SLP Cancellation Note ? ?Patient Details ?Name: Ethan Pearson ?MRN: [id number] ?DOB: 04-11-1980 ? ? ?Cancelled treatment:       Reason Eval/Treat Not Completed: Patient at procedure or test/unavailable (Pt currently with case amnagement. SLP will follow up later as schedule allows.) ? ?Erika Hussar I. Vear Clock, MS, CCC-SLP ?Acute Rehabilitation Services ?Office number [phone] ?Pager [phone] ? ?Scheryl Marten ?05/01/2021, 9:44 AM ?

## 2021-05-02 DIAGNOSIS — M7022 Olecranon bursitis, left elbow: Secondary | ICD-10-CM

## 2021-05-02 DIAGNOSIS — I639 Cerebral infarction, unspecified: Secondary | ICD-10-CM

## 2021-05-02 DIAGNOSIS — I6330 Cerebral infarction due to thrombosis of unspecified cerebral artery: Secondary | ICD-10-CM

## 2021-05-02 DIAGNOSIS — R509 Fever, unspecified: Secondary | ICD-10-CM

## 2021-05-02 DIAGNOSIS — S01112D Laceration without foreign body of left eyelid and periocular area, subsequent encounter: Secondary | ICD-10-CM

## 2021-05-02 DIAGNOSIS — S069X9S Unspecified intracranial injury with loss of consciousness of unspecified duration, sequela: Secondary | ICD-10-CM

## 2021-05-02 DIAGNOSIS — L0291 Cutaneous abscess, unspecified: Secondary | ICD-10-CM

## 2021-05-02 DIAGNOSIS — G9341 Metabolic encephalopathy: Secondary | ICD-10-CM

## 2021-05-02 LAB — CBC WITH DIFFERENTIAL/PLATELET
Abs Immature Granulocytes: 0.05 10*3/uL (ref 0.00–0.07)
Basophils Absolute: 0 10*3/uL (ref 0.0–0.1)
Basophils Relative: 0 %
Eosinophils Absolute: 0.1 10*3/uL (ref 0.0–0.5)
Eosinophils Relative: 1 %
HCT: 40.6 % (ref 39.0–52.0)
Hemoglobin: 13.7 g/dL (ref 13.0–17.0)
Immature Granulocytes: 0 %
Lymphocytes Relative: 25 %
Lymphs Abs: 3.4 10*3/uL (ref 0.7–4.0)
MCH: 30.4 pg (ref 26.0–34.0)
MCHC: 33.7 g/dL (ref 30.0–36.0)
MCV: 90.2 fL (ref 80.0–100.0)
Monocytes Absolute: 1.1 10*3/uL — ABNORMAL HIGH (ref 0.1–1.0)
Monocytes Relative: 8 %
Neutro Abs: 9 10*3/uL — ABNORMAL HIGH (ref 1.7–7.7)
Neutrophils Relative %: 66 %
Platelets: 653 10*3/uL — ABNORMAL HIGH (ref 150–400)
RBC: 4.5 MIL/uL (ref 4.22–5.81)
RDW: 12.7 % (ref 11.5–15.5)
WBC: 13.7 10*3/uL — ABNORMAL HIGH (ref 4.0–10.5)
nRBC: 0 % (ref 0.0–0.2)

## 2021-05-02 LAB — MAGNESIUM: Magnesium: 2.1 mg/dL (ref 1.7–2.4)

## 2021-05-02 LAB — BASIC METABOLIC PANEL
Anion gap: 9 (ref 5–15)
BUN: 13 mg/dL (ref 6–20)
CO2: 28 mmol/L (ref 22–32)
Calcium: 10 mg/dL (ref 8.9–10.3)
Chloride: 101 mmol/L (ref 98–111)
Creatinine, Ser: 0.73 mg/dL (ref 0.61–1.24)
GFR, Estimated: >60 mL/min (ref >60)
Glucose, Bld: 97 mg/dL (ref 70–99)
Potassium: 4.7 mmol/L (ref 3.5–5.1)
Sodium: 138 mmol/L (ref 135–145)

## 2021-05-02 MED ORDER — PANTOPRAZOLE SODIUM 40 MG PO TBEC: 40.0000 mg | DELAYED_RELEASE_TABLET | Freq: Every day | ORAL | Status: DC

## 2021-05-02 MED ORDER — THIAMINE HCL 100 MG PO TABS
100.0000 mg | ORAL_TABLET | Freq: Every day | ORAL | Status: AC
Start: 2021-05-03 — End: ?

## 2021-05-02 MED ORDER — MUPIROCIN 2 % EX OINT: 1.0000 "application " | TOPICAL_OINTMENT | Freq: Two times a day (BID) | CUTANEOUS | 0 refills | Status: AC

## 2021-05-02 MED ORDER — ADULT MULTIVITAMIN W/MINERALS CH: 1.0000 | ORAL_TABLET | Freq: Every day | ORAL | Status: AC

## 2021-05-02 MED ORDER — DOXYCYCLINE HYCLATE 100 MG PO TABS: 100.0000 mg | ORAL_TABLET | Freq: Two times a day (BID) | ORAL | 0 refills | Status: AC

## 2021-05-02 MED ORDER — ROSUVASTATIN CALCIUM 10 MG PO TABS: 10.0000 mg | ORAL_TABLET | Freq: Every day | ORAL | 1 refills | Status: AC

## 2021-05-02 MED ORDER — BACITRACIN ZINC 500 UNIT/GM EX OINT: TOPICAL_OINTMENT | Freq: Two times a day (BID) | CUTANEOUS | 0 refills | Status: AC

## 2021-05-02 MED ORDER — ACETAMINOPHEN 325 MG PO TABS: 650.0000 mg | ORAL_TABLET | Freq: Four times a day (QID) | ORAL | Status: AC | PRN

## 2021-05-02 MED ORDER — NICOTINE 21 MG/24HR TD PT24: 21.0000 mg | MEDICATED_PATCH | Freq: Every day | TRANSDERMAL | 0 refills | Status: AC

## 2021-05-02 MED ORDER — PANTOPRAZOLE SODIUM 40 MG PO TBEC
40.0000 mg | DELAYED_RELEASE_TABLET | Freq: Every day | ORAL | 1 refills | Status: AC
Start: 2021-05-02 — End: ?

## 2021-05-02 MED ORDER — AMOXICILLIN-POT CLAVULANATE 875-125 MG PO TABS: 1.0000 | ORAL_TABLET | Freq: Two times a day (BID) | ORAL | 0 refills | Status: AC

## 2021-05-02 MED ORDER — FOLIC ACID 1 MG PO TABS
1.0000 mg | ORAL_TABLET | Freq: Every day | ORAL | Status: AC
Start: 2021-05-03 — End: ?

## 2021-05-02 MED ORDER — IBUPROFEN 200 MG PO TABS
800.0000 mg | ORAL_TABLET | Freq: Three times a day (TID) | ORAL | Status: DC
  Administered 2021-05-02: 800 mg via ORAL
  Filled 2021-05-02: qty 4

## 2021-05-02 MED ORDER — IBUPROFEN 800 MG PO TABS
800.0000 mg | ORAL_TABLET | Freq: Three times a day (TID) | ORAL | 0 refills | Status: AC
Start: 2021-05-02 — End: 2021-05-07
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
## 2021-05-02 NOTE — TOC Transition Note (Signed)
Transition of Care (TOC) - CM/SW Discharge Note ? ? ?Patient Details  ?Name: Ethan Pearson ?MRN: [id number] ?Date of Birth: 12/28/1980 ? ?Transition of Care (TOC) CM/SW Contact:  ?Kermit Balo, RN ?Phone Number: ?05/02/2021, 2:53 PM ? ? ?Clinical Narrative:    ?Patient is discharging home to fathers today. Outpatient arranged through [facility] outpatient therapy. Information on the AVS. ?Patients father to provide transport home.  ? ? ?Final next level of care: OP Rehab ?Barriers to Discharge: No Barriers Identified ? ? ?Patient Goals and CMS Choice ?  ?  ?Choice offered to / list presented to : Parent ? ?Discharge Placement ?  ?           ?  ?  ?  ?  ? ?Discharge Plan and Services ?  ?Discharge Planning Services: CM Consult ?           ?  ?  ?  ?  ?  ?  ?  ?  ?  ?  ? ?Social Determinants of Health (SDOH) Interventions ?  ? ? ?Readmission Risk Interventions ?   ? View : No data to display.  ?  ?  ?  ? ? ? ? ? ?
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
## 2021-05-04 LAB — CULTURE, BLOOD (ROUTINE X 2)
Culture: NO GROWTH
Culture: NO GROWTH
Special Requests: ADEQUATE

## 2021-06-04 ENCOUNTER — Encounter: Payer: Self-pay | Admitting: Psychiatry

## 2021-06-04 ENCOUNTER — Ambulatory Visit: Payer: Medicaid Other | Admitting: Psychiatry

## 2021-06-05 ENCOUNTER — Other Ambulatory Visit: Payer: Self-pay | Admitting: *Deleted
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
## 2023-03-23 IMAGING — DX DG ELBOW 2V*L*
2 series · 2 of 2 positions shown · non-contrast
Comparison: None.

CLINICAL DATA: Reported left elbow olecranon bursitis with a
draining wound.

EXAM:
LEFT ELBOW - 2 VIEW

[elbow lat]
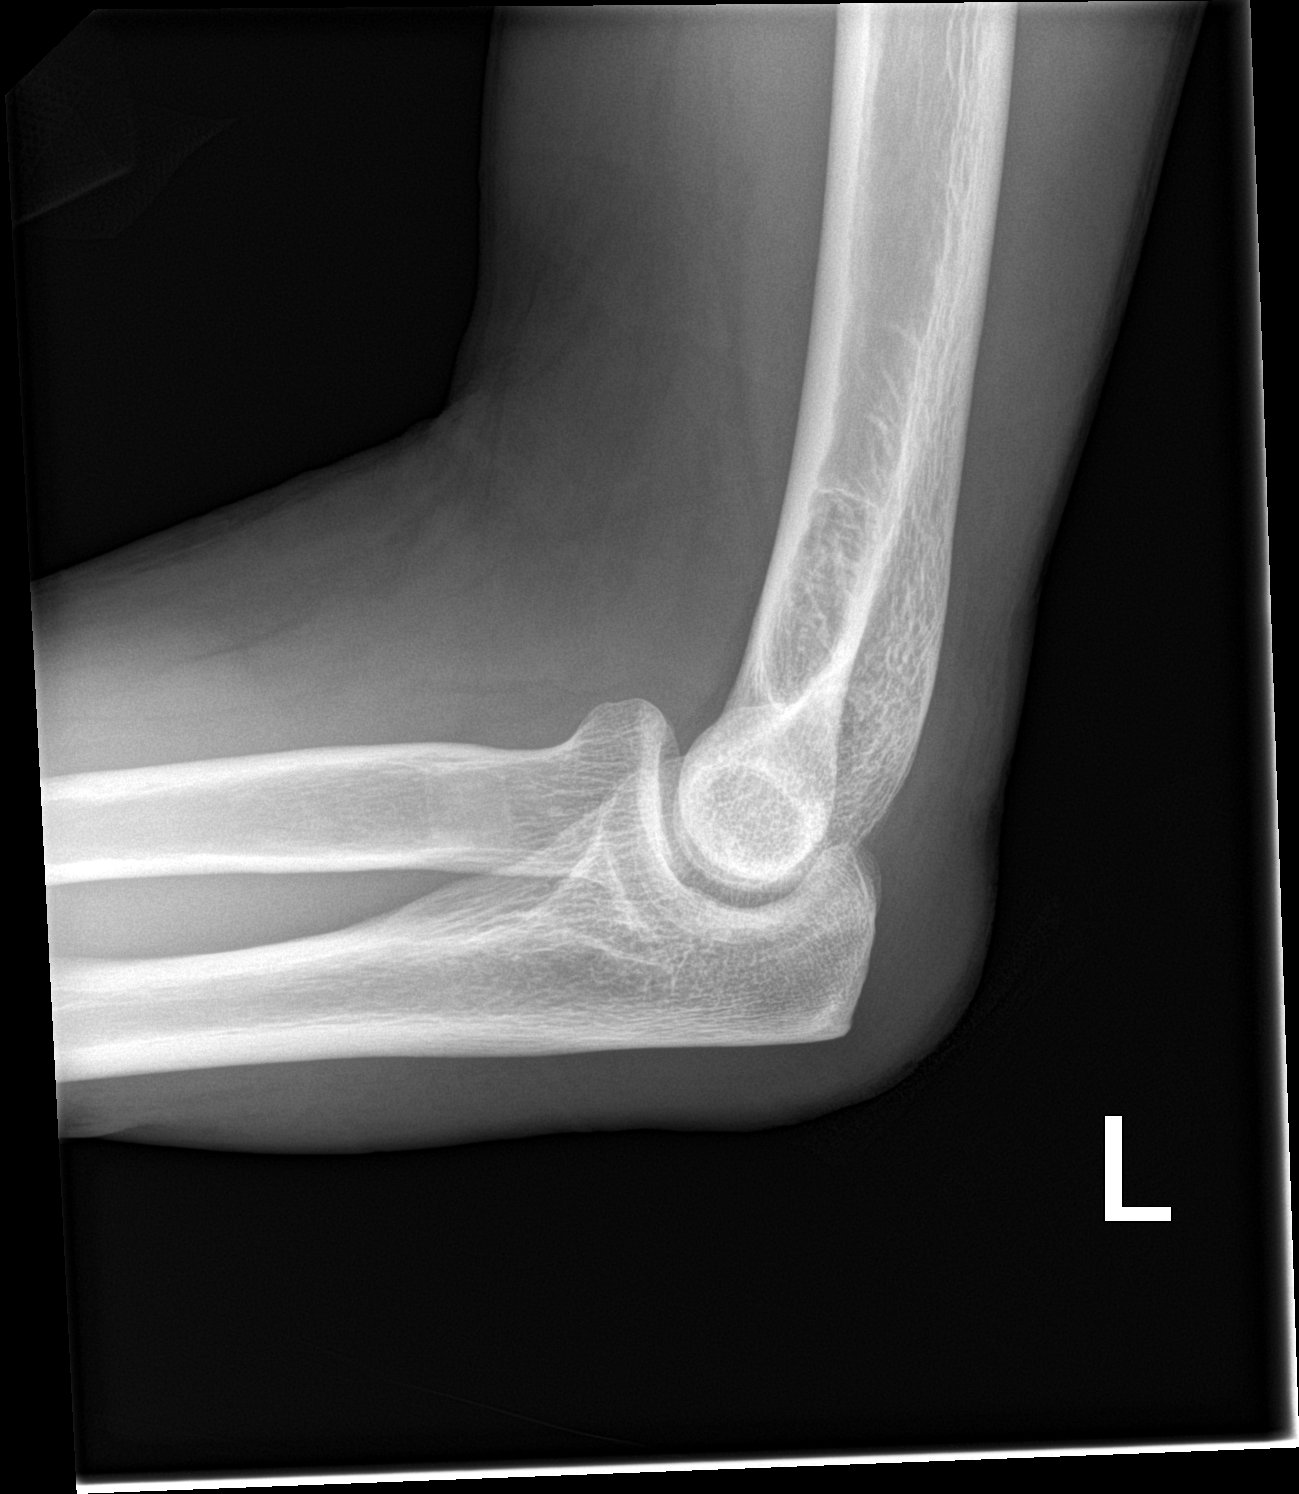

[elbow ap]
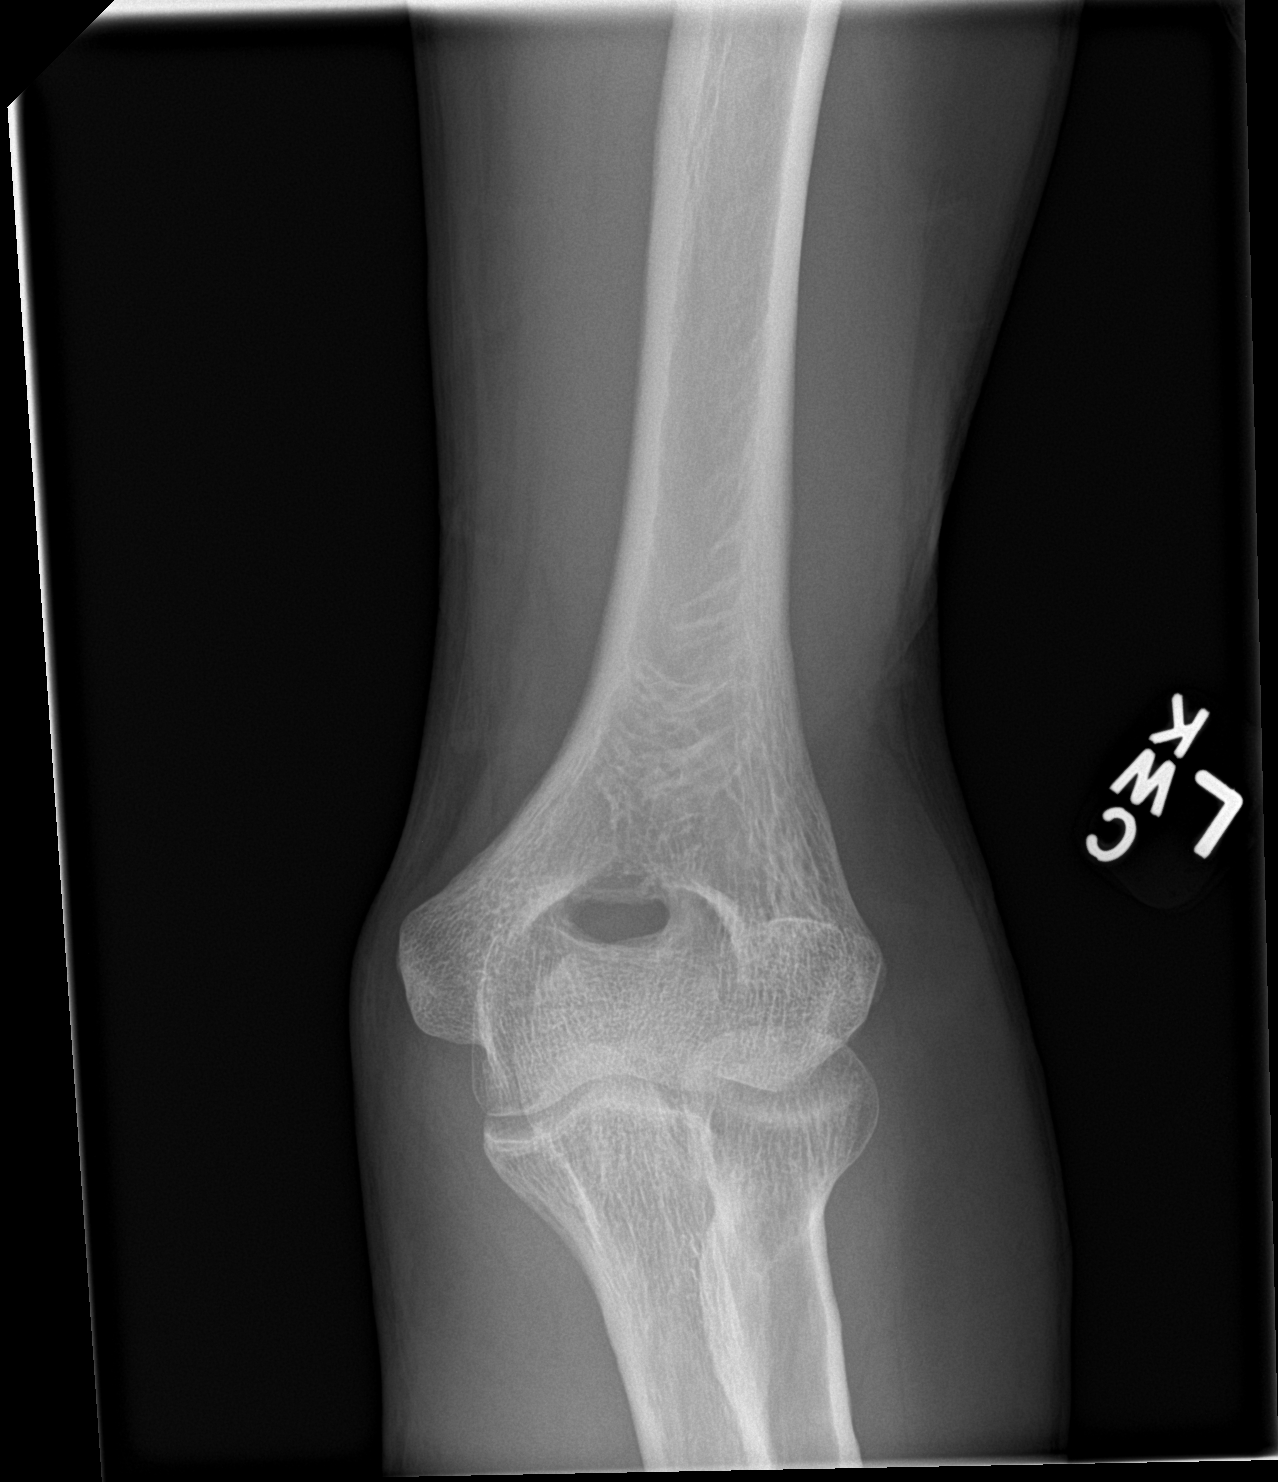

[2 of 2 positions shown; findings below may reference images not displayed]

FINDINGS: No fracture or bone lesion. No bone resorption to suggest
osteomyelitis.

Elbow joint normally spaced and aligned.  No joint effusion.

Posterior soft tissue swelling consistent with olecranon bursal
fluid/bursitis. No soft tissue air.
IMPRESSION: 1. No fracture or bone lesion. No evidence of osteomyelitis and no
joint effusion.
2. Posterior elbow soft tissue swelling consistent with olecranon
bursitis.

## 2023-03-24 IMAGING — DX DG CHEST 1V PORT
1 series · 1 of 1 positions shown · non-contrast
Comparison: 04/19/2021

CLINICAL DATA: Fever

EXAM:
PORTABLE CHEST 1 VIEW

[chest]
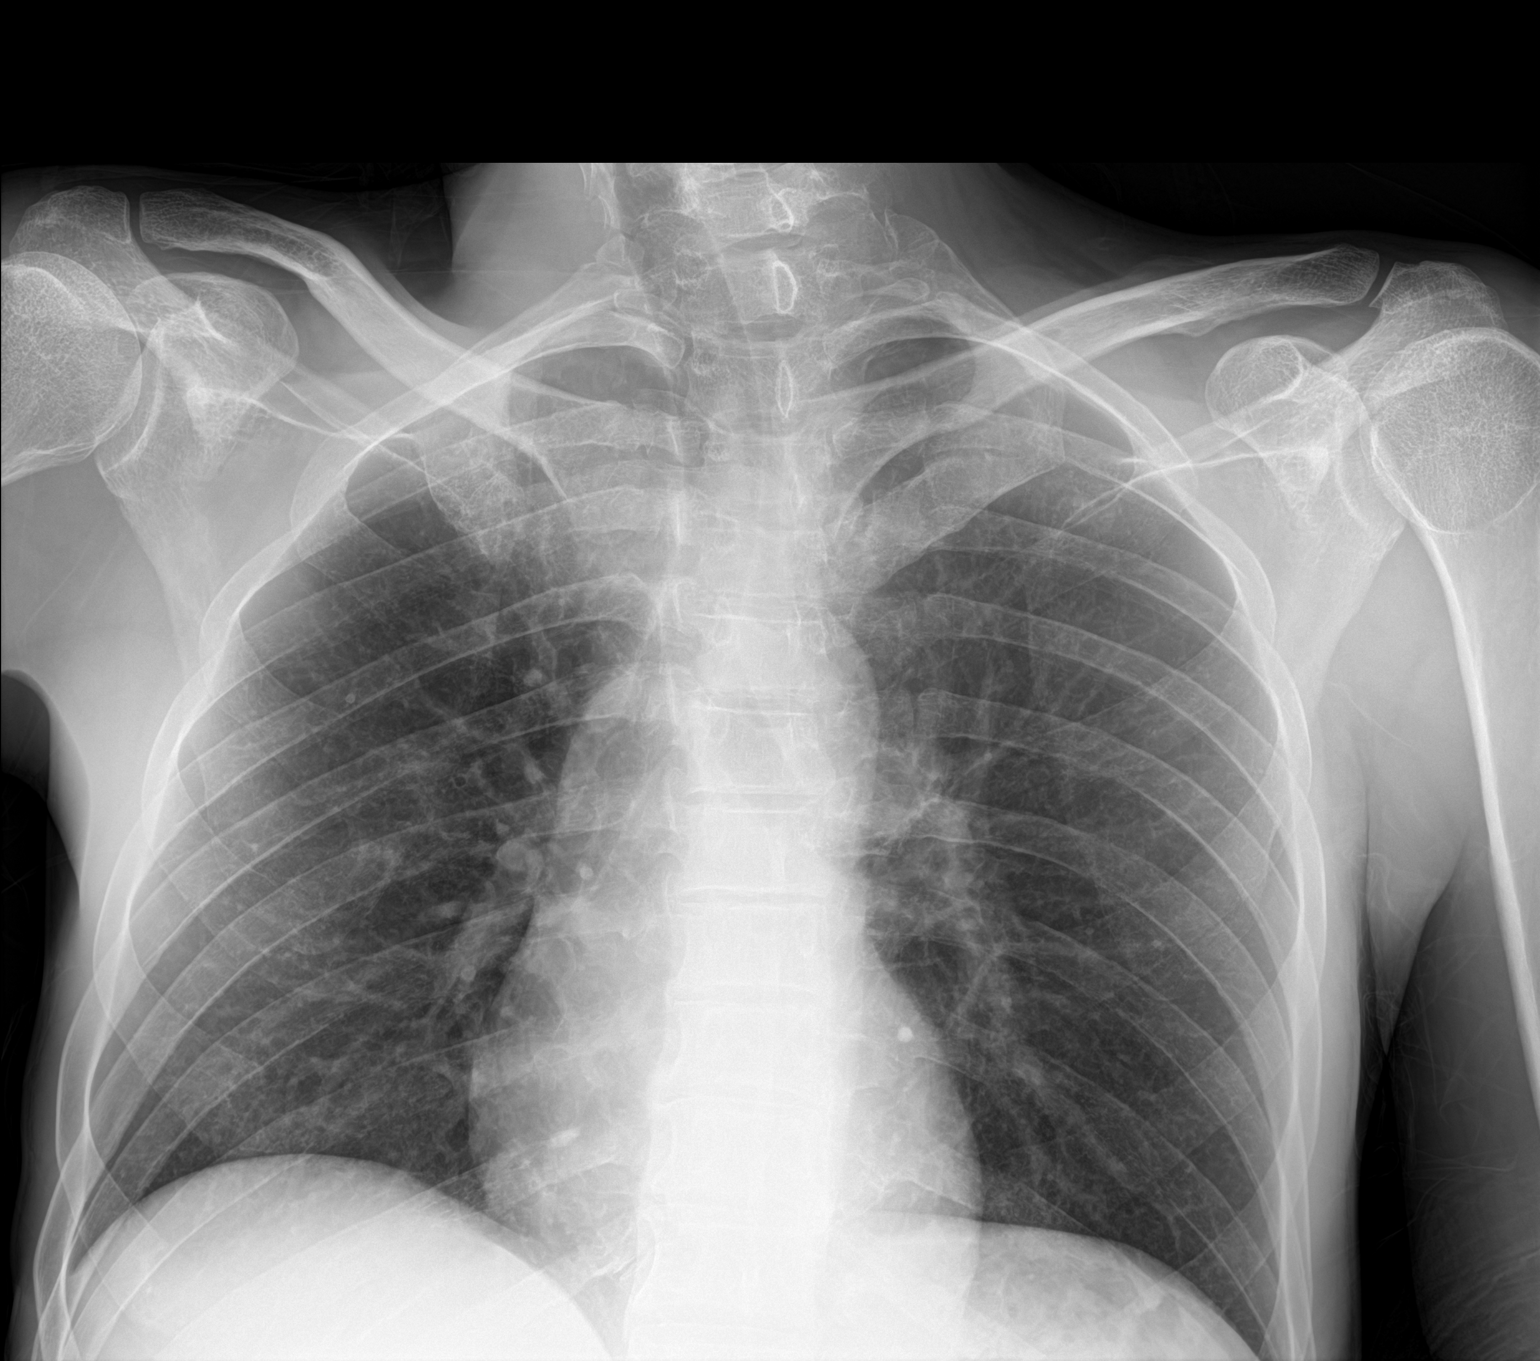

[1 of 1 positions shown; findings below may reference images not displayed]

FINDINGS: The heart size and mediastinal contours are within normal limits.
Both lungs are clear. The visualized skeletal structures are
unremarkable.
IMPRESSION: No acute abnormality of the lungs in AP portable projection.
